# Patient Record
Sex: Female | Born: 1960 | Race: White | Hispanic: No | Smoking: Former smoker
Health system: Southern US, Community
[De-identification: ages and names within clinical notes are randomized; demographics above are authoritative.]

## PROBLEM LIST (undated history)

## (undated) DIAGNOSIS — I10 Essential (primary) hypertension: Secondary | ICD-10-CM

## (undated) DIAGNOSIS — C8590 Non-Hodgkin lymphoma, unspecified, unspecified site: Secondary | ICD-10-CM

## (undated) DIAGNOSIS — I314 Cardiac tamponade: Secondary | ICD-10-CM

## (undated) DIAGNOSIS — J9859 Other diseases of mediastinum, not elsewhere classified: Secondary | ICD-10-CM

## (undated) DIAGNOSIS — R0602 Shortness of breath: Secondary | ICD-10-CM

## (undated) DIAGNOSIS — I3139 Other pericardial effusion (noninflammatory): Secondary | ICD-10-CM

## (undated) DIAGNOSIS — B029 Zoster without complications: Secondary | ICD-10-CM

## (undated) DIAGNOSIS — F419 Anxiety disorder, unspecified: Secondary | ICD-10-CM

## (undated) DIAGNOSIS — I313 Pericardial effusion (noninflammatory): Secondary | ICD-10-CM

## (undated) DIAGNOSIS — Z5189 Encounter for other specified aftercare: Secondary | ICD-10-CM

## (undated) HISTORY — DX: Zoster without complications: B02.9

## (undated) HISTORY — PX: OTHER SURGICAL HISTORY: SHX169

## (undated) HISTORY — DX: Cardiac tamponade: I31.4

## (undated) HISTORY — DX: Anxiety disorder, unspecified: F41.9

## (undated) HISTORY — PX: BONE MARROW TRANSPLANT: SHX200

## (undated) HISTORY — DX: Encounter for other specified aftercare: Z51.89

---

## 1994-10-17 DIAGNOSIS — C8350 Lymphoblastic (diffuse) lymphoma, unspecified site: Secondary | ICD-10-CM | POA: Insufficient documentation

## 2014-01-31 DIAGNOSIS — M779 Enthesopathy, unspecified: Secondary | ICD-10-CM

## 2014-05-17 DIAGNOSIS — J9859 Other diseases of mediastinum, not elsewhere classified: Secondary | ICD-10-CM

## 2014-05-17 HISTORY — DX: Other diseases of mediastinum, not elsewhere classified: J98.59

## 2014-05-18 ENCOUNTER — Emergency Department (HOSPITAL_COMMUNITY): Payer: 59

## 2014-05-18 ENCOUNTER — Telehealth: Payer: Self-pay | Admitting: Internal Medicine

## 2014-05-18 ENCOUNTER — Encounter (HOSPITAL_COMMUNITY): Payer: Self-pay | Admitting: Emergency Medicine

## 2014-05-18 ENCOUNTER — Inpatient Hospital Stay (HOSPITAL_COMMUNITY)
Admission: EM | Admit: 2014-05-18 | Discharge: 2014-05-20 | DRG: 204 | Disposition: A | Discharge status: Other Acute Inpatient Hospital | Payer: 59 | Attending: Internal Medicine | Admitting: Internal Medicine

## 2014-05-18 DIAGNOSIS — Z79899 Other long term (current) drug therapy: Secondary | ICD-10-CM

## 2014-05-18 DIAGNOSIS — Z87891 Personal history of nicotine dependence: Secondary | ICD-10-CM | POA: Diagnosis not present

## 2014-05-18 DIAGNOSIS — D72829 Elevated white blood cell count, unspecified: Secondary | ICD-10-CM | POA: Diagnosis present

## 2014-05-18 DIAGNOSIS — I2489 Other forms of acute ischemic heart disease: Secondary | ICD-10-CM | POA: Diagnosis present

## 2014-05-18 DIAGNOSIS — F411 Generalized anxiety disorder: Secondary | ICD-10-CM

## 2014-05-18 DIAGNOSIS — I319 Disease of pericardium, unspecified: Secondary | ICD-10-CM | POA: Diagnosis present

## 2014-05-18 DIAGNOSIS — R9431 Abnormal electrocardiogram [ECG] [EKG]: Secondary | ICD-10-CM | POA: Diagnosis present

## 2014-05-18 DIAGNOSIS — R222 Localized swelling, mass and lump, trunk: Secondary | ICD-10-CM | POA: Diagnosis present

## 2014-05-18 DIAGNOSIS — R002 Palpitations: Secondary | ICD-10-CM

## 2014-05-18 DIAGNOSIS — E785 Hyperlipidemia, unspecified: Secondary | ICD-10-CM | POA: Diagnosis present

## 2014-05-18 DIAGNOSIS — I10 Essential (primary) hypertension: Secondary | ICD-10-CM | POA: Diagnosis present

## 2014-05-18 DIAGNOSIS — Z9221 Personal history of antineoplastic chemotherapy: Secondary | ICD-10-CM | POA: Diagnosis not present

## 2014-05-18 DIAGNOSIS — R000 Tachycardia, unspecified: Secondary | ICD-10-CM | POA: Diagnosis present

## 2014-05-18 DIAGNOSIS — R7989 Other specified abnormal findings of blood chemistry: Secondary | ICD-10-CM | POA: Diagnosis present

## 2014-05-18 DIAGNOSIS — I248 Other forms of acute ischemic heart disease: Secondary | ICD-10-CM | POA: Diagnosis present

## 2014-05-18 DIAGNOSIS — Z8579 Personal history of other malignant neoplasms of lymphoid, hematopoietic and related tissues: Secondary | ICD-10-CM

## 2014-05-18 DIAGNOSIS — G4700 Insomnia, unspecified: Secondary | ICD-10-CM | POA: Diagnosis present

## 2014-05-18 DIAGNOSIS — Z87898 Personal history of other specified conditions: Secondary | ICD-10-CM | POA: Diagnosis not present

## 2014-05-18 DIAGNOSIS — R0602 Shortness of breath: Secondary | ICD-10-CM

## 2014-05-18 DIAGNOSIS — J90 Pleural effusion, not elsewhere classified: Secondary | ICD-10-CM | POA: Diagnosis present

## 2014-05-18 DIAGNOSIS — J9859 Other diseases of mediastinum, not elsewhere classified: Secondary | ICD-10-CM | POA: Diagnosis present

## 2014-05-18 DIAGNOSIS — R778 Other specified abnormalities of plasma proteins: Secondary | ICD-10-CM | POA: Diagnosis present

## 2014-05-18 DIAGNOSIS — R748 Abnormal levels of other serum enzymes: Secondary | ICD-10-CM | POA: Diagnosis present

## 2014-05-18 HISTORY — DX: Essential (primary) hypertension: I10

## 2014-05-18 HISTORY — DX: Pericardial effusion (noninflammatory): I31.3

## 2014-05-18 HISTORY — DX: Other diseases of mediastinum, not elsewhere classified: J98.59

## 2014-05-18 HISTORY — DX: Other pericardial effusion (noninflammatory): I31.39

## 2014-05-18 HISTORY — DX: Non-Hodgkin lymphoma, unspecified, unspecified site: C85.90

## 2014-05-18 HISTORY — DX: Shortness of breath: R06.02

## 2014-05-18 LAB — I-STAT TROPONIN, ED: Troponin i, poc: 0.53 ng/mL (ref 0.00–0.08)

## 2014-05-18 LAB — CBC WITH DIFFERENTIAL/PLATELET
Basophils Absolute: 0.1 10*3/uL (ref 0.0–0.1)
Basophils Relative: 1 % (ref 0–1)
Eosinophils Absolute: 0.1 10*3/uL (ref 0.0–0.7)
Eosinophils Relative: 1 % (ref 0–5)
HCT: 44.8 % (ref 36.0–46.0)
Hemoglobin: 15.4 g/dL — ABNORMAL HIGH (ref 12.0–15.0)
Lymphocytes Relative: 28 % (ref 12–46)
Lymphs Abs: 3.3 10*3/uL (ref 0.7–4.0)
MCH: 31.2 pg (ref 26.0–34.0)
MCHC: 34.4 g/dL (ref 30.0–36.0)
MCV: 90.7 fL (ref 78.0–100.0)
Monocytes Absolute: 1 10*3/uL (ref 0.1–1.0)
Monocytes Relative: 9 % (ref 3–12)
Neutro Abs: 7.2 10*3/uL (ref 1.7–7.7)
Neutrophils Relative %: 61 % (ref 43–77)
Platelets: 287 10*3/uL (ref 150–400)
RBC: 4.94 MIL/uL (ref 3.87–5.11)
RDW: 14 % (ref 11.5–15.5)
WBC: 11.6 10*3/uL — ABNORMAL HIGH (ref 4.0–10.5)

## 2014-05-18 LAB — URINALYSIS, ROUTINE W REFLEX MICROSCOPIC
Glucose, UA: NEGATIVE mg/dL
Hgb urine dipstick: NEGATIVE
Ketones, ur: 15 mg/dL — AB
Leukocytes, UA: NEGATIVE
Nitrite: NEGATIVE
Protein, ur: 30 mg/dL — AB
Specific Gravity, Urine: 1.022 (ref 1.005–1.030)
Urobilinogen, UA: 0.2 mg/dL (ref 0.0–1.0)
pH: 6 (ref 5.0–8.0)

## 2014-05-18 LAB — MAGNESIUM: MAGNESIUM: 1.7 mg/dL (ref 1.5–2.5)

## 2014-05-18 LAB — COMPREHENSIVE METABOLIC PANEL
ALT: 47 U/L — ABNORMAL HIGH (ref 0–35)
AST: 48 U/L — ABNORMAL HIGH (ref 0–37)
Albumin: 4.1 g/dL (ref 3.5–5.2)
Alkaline Phosphatase: 82 U/L (ref 39–117)
Anion gap: 20 — ABNORMAL HIGH (ref 5–15)
BUN: 18 mg/dL (ref 6–23)
CO2: 21 mEq/L (ref 19–32)
Calcium: 10.1 mg/dL (ref 8.4–10.5)
Chloride: 104 mEq/L (ref 96–112)
Creatinine, Ser: 0.76 mg/dL (ref 0.50–1.10)
GFR calc Af Amer: >90 mL/min (ref >90)
GFR calc non Af Amer: >90 mL/min (ref >90)
Glucose, Bld: 96 mg/dL (ref 70–99)
Potassium: 3.9 mEq/L (ref 3.7–5.3)
Sodium: 145 mEq/L (ref 137–147)
Total Bilirubin: 0.4 mg/dL (ref 0.3–1.2)
Total Protein: 7.7 g/dL (ref 6.0–8.3)

## 2014-05-18 LAB — URINE MICROSCOPIC-ADD ON

## 2014-05-18 LAB — TROPONIN I
Troponin I: 0.89 ng/mL (ref <0.30)
Troponin I: 2.26 ng/mL (ref <0.30)
Troponin I: 2.02 ng/mL (ref <0.30)

## 2014-05-18 LAB — T4, FREE: Free T4: 1.36 ng/dL (ref 0.80–1.80)

## 2014-05-18 LAB — PROTIME-INR
INR: 1.1 (ref 0.00–1.49)
Prothrombin Time: 14.2 seconds (ref 11.6–15.2)

## 2014-05-18 LAB — TSH: TSH: 1.47 u[IU]/mL (ref 0.350–4.500)

## 2014-05-18 MED ORDER — ALLOPURINOL 300 MG PO TABS
300.0000 mg | ORAL_TABLET | Freq: Every day | ORAL | Status: DC
  Administered 2014-05-19: 300 mg via ORAL
  Filled 2014-05-18: qty 1

## 2014-05-18 MED ORDER — LISINOPRIL 5 MG PO TABS
5.0000 mg | ORAL_TABLET | Freq: Every day | ORAL | Status: DC
  Administered 2014-05-18 – 2014-05-19 (×2): 5 mg via ORAL
  Filled 2014-05-18 (×2): qty 1

## 2014-05-18 MED ORDER — IOHEXOL 350 MG/ML SOLN
100.0000 mL | Freq: Once | INTRAVENOUS | Status: AC | PRN
  Administered 2014-05-18: 100 mL via INTRAVENOUS

## 2014-05-18 MED ORDER — SODIUM CHLORIDE 0.9 % IV SOLN: INTRAVENOUS | Status: DC

## 2014-05-18 MED ORDER — METOPROLOL TARTRATE 25 MG PO TABS
25.0000 mg | ORAL_TABLET | Freq: Two times a day (BID) | ORAL | Status: DC
  Administered 2014-05-18 (×2): 25 mg via ORAL
  Filled 2014-05-18 (×4): qty 1

## 2014-05-18 MED ORDER — SODIUM CHLORIDE 0.9 % IJ SOLN
3.0000 mL | Freq: Two times a day (BID) | INTRAMUSCULAR | Status: DC
  Administered 2014-05-18 – 2014-05-19 (×4): 3 mL via INTRAVENOUS

## 2014-05-18 MED ORDER — ASPIRIN EC 325 MG PO TBEC
325.0000 mg | DELAYED_RELEASE_TABLET | Freq: Once | ORAL | Status: AC
  Administered 2014-05-18: 325 mg via ORAL
  Filled 2014-05-18: qty 1

## 2014-05-18 MED ORDER — ENOXAPARIN SODIUM 40 MG/0.4ML ~~LOC~~ SOLN
40.0000 mg | SUBCUTANEOUS | Status: DC
  Filled 2014-05-18 (×2): qty 0.4

## 2014-05-18 MED ORDER — MAGNESIUM SULFATE 40 MG/ML IJ SOLN
2.0000 g | Freq: Once | INTRAMUSCULAR | Status: AC
  Administered 2014-05-18: 2 g via INTRAVENOUS
  Filled 2014-05-18: qty 50

## 2014-05-18 MED ORDER — ZOLPIDEM TARTRATE 5 MG PO TABS
5.0000 mg | ORAL_TABLET | Freq: Every evening | ORAL | Status: DC | PRN
  Administered 2014-05-18 – 2014-05-19 (×2): 5 mg via ORAL
  Filled 2014-05-18 (×2): qty 1

## 2014-05-18 NOTE — Telephone Encounter (Signed)
I spoke with pt on Mon 8/31 and agreed to work in as new pt to my PCP panel. Pt admitted this AM for mediastinal mass in setting of DOE Chart reviewed to date. Will help arrange follow up when ready for DC Thanks to IP care team.

## 2014-05-18 NOTE — H&P (Signed)
Agree with medical student Sonny Dandy note see my note for more details   Aundra Dubin MD

## 2014-05-18 NOTE — Progress Notes (Addendum)
Patient has an elevated heart rate extending up to 150's at times. Last troponin 2.26.  Health care provider has been notified and cardiology is also aware.

## 2014-05-18 NOTE — Consult Note (Signed)
[facility]  Telephone:([phone] [facility] NOTE I have seen [facility] patient, examined her and edited [facility]yl notes as follows  C[facility]                                MR#: [id number]  DOB: [birth year]                                    CSN#: [id number]  Referring MD: Teaching Service  Primary MD:  [facility] [place]   Reason for Consult: Lymphoma   [facility] [place] [name] is a 53 y.o. female with a history of non-Hodgkin's lymphoma diagnosed in February of 1996 treated at [facility] & by Dr. Learta [facility] with chemotherapy, having been in observation for many years. Those records are currently pending, although they have been requested. She presented to [facility] emergency department with worsening shortness of breath at rest and on exertion, with orthopnea, as well as palpitations for about one month. She has been seen several times as an outpatient, Cheryl first time treated for bronchitis with antibiotics and prednisone. Her symptoms did not improve,so she sought attention of her primary care physician at [facility]. Physical at that facility was essentially unremarkable. Those records are being requested as well. As her respiratory symptoms worsened, she developed anxiety due to her inability to breathe, which brought her to [facility] emergency department. She denies any fever, chills, but she did have occasionally night sweats, last episode last night. She denies any new areas of swelling, or lymphadenopathy. She does have a dry cough but denies hemoptysis. She denies any hemoptysis. She has intermittent exertional chest pain. she denies any abdominal pain, or vomiting. She did have nausea today, which still has not resolved, which [facility] patient attributes to her lack of appetite over Cheryl last few days with a 40 lb weight loss. she denies any pain. No mental status changes. She does have generalized malaise. Denies risk for Hepatitis or HIV.  CT scan shows [facility] chest with  and without contrast on 05/18/14 was negative for PE, but remarkable for a large anterior mediastinal mass measuring at least 11.1 cm  by 6.5 cm by 8.7 cm, with extensive mediastinal adenopathy most likely due to lymphoma. Loculated but small left pleural effusion and small right pleural effusion  and pericardial effusion are noted. CT of [facility] abdomen and pelvis currently pending.  PMH:  Past Medical History  Diagnosis Date  . Non Hodgkin's lymphoma     a. Dx 1996, tx with chemotherapy.  . Pericardial effusion     a. at time of NHL diagnosis, s/p tube placement per patient.    Surgeries:  Past Surgical History  Procedure Laterality Date  . Port a cath placement      Allergies: No Known Allergies  Medications:  Scheduled Meds: . lisinopril  5 mg Oral Daily  . metoprolol tartrate  25 mg Oral BID    ROS: Constitutional: Denies fevers, chills, positive for abnormal night sweats Eyes: Denies blurriness of vision, double vision or watery eyes Ears, nose, mouth, throat, and face: Denies mucositis or sore throat Respiratory: positive cough, dyspnea but no wheezes Cardiovascular: POsitive for  palpitations since early July, positive for chest discomfort, no  lower extremity swelling Gastrointestinal: Positive  nausea,  denies heartburn or change in bowel habits. [facility] [place]  lb weight loss over Cheryl last 6 months Skin: Denies abnormal skin rashes Lymphatics: Denies new lymphadenopathy or easy bruising Neurological:Denies numbness, tingling or new weaknesses Behavioral/Psych: Mood is anxious secondary to respiratory distress All other systems were reviewed with [facility] patient and are negative.   Family History:    Family History  Problem Relation Age of Onset  . Valvular heart disease Mother   . Depression Father     No family history of hematological  disorders.  Social History:  reports that she has quit smoking. Her smoking use included Cigarettes. She smoked 0.00 packs per day. She does  not have any smokeless tobacco history on file. She reports that she does not drink alcohol or use illicit drugs. 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 Until recently she was able to walk 3-7 miles daily.    Physical Exam:  Filed Vitals:   05/18/14 1330  BP: 148/107  Pulse: 124  Temp:   Resp: 31   There were no vitals filed for this visit. ECOG PERFORMANCE STATUS: 2   Symptomatic, >50% in bed during Cheryl day (Ambulatory and capable of all self care but unable to carry out any work activities. Up and about more than 50% of   GENERAL:alert,unconfortable due to shortness of breath, anxious SKIN: skin color, texture, turgor are normal, no rashes or significant lesions EYES: normal, conjunctiva are pink and non-injected, sclera clear OROPHARYNX:no exudate, no erythema and lips, buccal mucosa, and tongue normal  NECK: supple, thyroid normal size, non-tender, without nodularity LYMPH:  no palpable lymphadenopathy in Cheryl cervical, axillary or inguinal area LUNGS: clear to auscultation and percussion with normal breathing effort HEART: tachycardia with no murmurs and no lower extremity edema ABDOMEN:abdomen soft, non-tender and normal bowel sounds Musculoskeletal:no cyanosis of digits and no clubbing  PSYCH: alert & oriented x 3 with fluent speech, anxious, tearful. NEURO: no focal motor/sensory deficits   Labs:  CBC   Recent Labs Lab 05/18/14 0940  WBC 11.6*  HGB 15.4*  HCT 44.8  PLT 287  MCV 90.7  MCH 31.2  MCHC 34.4  RDW 14.0  LYMPHSABS 3.3  MONOABS 1.0  EOSABS 0.1  BASOSABS 0.1     CMP    Recent Labs Lab 05/18/14 0940  NA 145  K 3.9  CL 104  CO2 21  GLUCOSE 96  BUN 18  CREATININE 0.76  CALCIUM 10.1  AST 48*  ALT 47*  ALKPHOS 82  BILITOT 0.4        Component Value Date/Time   BILITOT 0.4 05/18/2014 0940     Anemia panel:  No results found for this basename: VITAMINB12, FOLATE, FERRITIN, TIBC,  IRON, RETICCTPCT,  in Cheryl last 72 hours   Imaging Studies: I have reviewed [facility]yl imaging study with Cheryl patient Ct Angio Chest Pe W/cm &/or Wo Cm  05/18/2014   CLINICAL DATA:  Chest tightness.  History of lymphoma.  EXAM: CT ANGIOGRAPHY CHEST WITH CONTRAST  TECHNIQUE: Multidetector CT imaging of Cheryl chest was performed using Cheryl standard protocol during bolus administration of intravenous contrast. Multiplanar CT image reconstructions and MIPs were obtained to evaluate Cheryl vascular anatomy.  CONTRAST:  100 mL OMNIPAQUE IOHEXOL 350 MG/ML SOLN  COMPARISON:  PA and lateral chest earlier this same day.  FINDINGS: No pulmonary embolus is identified. Cheryl patient has a mass in Cheryl anterior mediastinum measuring at least 11.1 cm transverse by 6.5 cm AP by 8.7 cm craniocaudal. There are areas of  necrosis within this mass lesion. Small axillary lymph nodes are identified. Index node on Cheryl left measures 1 cm in diameter on image 18 and index node on Cheryl right measures 0.7 cm on image 26. A precarinal node measuring 1.6 cm is seen on image 42. A node anterior to Cheryl carina eccentric to Cheryl right on image 39 measures 2.4 x 2.5 cm.  [facility] patient has a small right pleural effusion and a small pericardial effusion. A somewhat larger left pleural effusion has a lobulated appearance and is circumferential about Cheryl left lung worrisome for loculation.  Scattered airspace disease in Cheryl left lung has an appearance most compatible with compressive atelectasis secondary to pleural effusion. Mild atelectasis on Cheryl right is identified.  Visualized intra-abdominal contents are unremarkable. No focal bony abnormality is identified.  Review of Cheryl MIP images confirms Cheryl above findings.  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 Less likely differential considerations include invasive thymoma or germ cell tumor. Cheryl lesion does not appear to arise from Cheryl thyroid.   Loculated but small left pleural effusion.  Small right pleural effusion pericardial effusion appears simple.  Negative for pulmonary embolus.  Cardiomegaly.   Electronically Signed   By: Inge Rise M.D.   On: 05/18/2014 12:04   A/P: 53 y.o. female with: History of Non Hodgkins Lymphoma with large mediastinal mass with adenopathy suspicious for NHL recurrence NHL diagnosed in 1996, status post chemotherapy at [facility]. She was in remission at least dating back to 2006 per patient report. She presented with increasing shortness of breath, found to have pleural effusion, as well as a very large mediastinal mass with adenopathy, suspicious for NHL recurrence. CT of Cheryl abdomen and pelvis are currently pending. She will probably need cardiothoracic surgery consultation if Ct abdomen & pelvis do not reveal other suspicious sites for safe biopsy.   Patient is to undergo biopsy of Cheryl mediastinum mass versus a lymph node that may be seen more peripherally for definite diagnosis. Dr. Benay Spice has been notified of Cheryl patient's admission, and will see Ms. Ryce upon his return as he is out of town at this time.  Admitting team is to request pertinent records from [facility] for recent history, as well as from [facility] for review of her treatments in studies performed at that facility Will continue to follow with you.  DVT prophylaxis On Lovenox  Leukocytosis Reactive due to likely NHL recurrence. [facility] patient is afebrile. She also received a course of prednisone as she was being treated for unresolved bronchitis at this time, continue to monitor, no intervention is indicated  Palpitations with tachycardia Elevated troponin  Cardiology consulted. She will need ECHO. I will start her on gentle IVF hydration  Tumor lysis prophylaxis I will order uric acid and LDH level. Will start her on allopurinol tomorrow along with IVF  Insomnia and anxiety She requested Ambien   **Disclaimer: This note  was dictated with voice recognition software. Similar sounding words can inadvertently be transcribed and this note may contain transcription errors which may not have been corrected upon publication of note.Sharene Butters E, PA-C 05/18/2014 2:32 PM  Venola Castello, MD 05/18/2014

## 2014-05-18 NOTE — ED Notes (Signed)
Attempted report 

## 2014-05-18 NOTE — Consult Note (Signed)
Cardiology Consultation Note  Patient ID: [facility], MRN: [id number], DOB/AGE: Dec 03, 1960 53 y.o. Admit date: 05/18/2014   Date of Consult: 05/18/2014 Primary Physician: Dr. Gwendolyn Grant Primary Cardiologist: New   Chief Complaint: SOB, chest discomfort Reason for Consult: chest discomfort & elevated troponin in a patient with h/o remote non-Hodgkin's lymphoma with CT scan today showing large mediastinal mass  HPI: [facility] is a 53 y/o F with history of non-Hodgkin's lymphoma associated with pericardial effusion in 1996 s/p chemotherapy but no other routine medical problems who presented to [facility] today with dyspnea and chest discomfort. Since March 2015 she has made efforts to eat healthier and exercise, walking up to 6-7 miles a day from her home to her [place] on [address]. She has gradually lost 43 lbs over 6 months' time but this was intentional weight loss. For the past 4 weeks she has been experiencing progressive SOB, DOE, and orthopnea. She has sought care at urgent care several times including once when she had a fever of 101.8 and was treated with steroids and antibiotics without relief. She denies further fever but has had night sweats the last week. She began to develop intermittent chest discomfort that later became persistent as of 8/31 - it is not worse with exertion or anything in particular other than perhaps lying back. She feels like she can't quite get a deep breath. She's had to cut down her walking to 1-2 miles a day due to her dyspnea. In the ED, POC troponin 0.53 and full troponin 0.89. WBC 11.6k, mildly elevated AST/ALT 48/47, abnormal CXR with subsequent CTA ruling out PE but showing: large anterior mediastinal mass with extensive mediastinal adenopathy is most likely due to lymphoma; less likely considerations include invasive thymoma or germ cell tumor. Loculated but small left pleural effusion, small right pleural effusion, small pericardial  effusion. She is hypertensive up to 152/108 with no prior history of such. O2 sat 93-99% RA. HR in the 120's persistently sinus tach.   She is good friends with Dr. Gwendolyn Grant, who she says will be her PCP.  Past Medical History  Diagnosis Date  . Non Hodgkin's lymphoma     a. Dx 1996, tx with chemotherapy.  . Pericardial effusion     a. at time of NHL diagnosis, s/p tube placement per patient.      Most Recent Cardiac Studies: None   Surgical History:  Past Surgical History  Procedure Laterality Date  . Port a cath placement       Home Meds: Prior to Admission medications   Medication Sig Start Date End Date Taking? Authorizing Provider  predniSONE (STERAPRED UNI-PAK) 5 MG TABS tablet Take by mouth as directed. Taper as directed for 10 days. Starting 8/29   Yes Historical Provider, MD  sertraline (ZOLOFT) 50 MG tablet Take 25 mg by mouth daily.   Yes Historical Provider, MD    Inpatient Medications:     Allergies: No Known Allergies  History   Social History  . Marital Status: Single    Spouse Name: N/A    Number of Children: N/A  . Years of Education: N/A   Occupational History  . Not on file.   Social History Main Topics  . Smoking status: Former Smoker    Types: Cigarettes  . Smokeless tobacco: Not on file  . Alcohol Use: No  . Drug Use: No  . Sexual Activity: Not on file   Other Topics Concern  .  Not on file   Social History Narrative  . No narrative on file     Family History  Problem Relation Age of Onset  . Valvular heart disease Mother   . Depression Father      Review of Systems: General: negative for chills, fever, night sweats or weight changes.  Cardiovascular: negative for chest pain, edema, orthopnea, palpitations, paroxysmal nocturnal dyspnea, shortness of breath or dyspnea on exertion Dermatological: negative for rash Respiratory: negative for cough or wheezing Urologic: negative for hematuria Abdominal: negative for  nausea, vomiting, diarrhea, bright red blood per rectum, melena, or hematemesis Neurologic: negative for visual changes, syncope, or dizziness All other systems reviewed and are otherwise negative except as noted above.  Labs:  Recent Labs  05/18/14 0940  TROPONINI 0.89*   Lab Results  Component Value Date   WBC 11.6* 05/18/2014   HGB 15.4* 05/18/2014   HCT 44.8 05/18/2014   MCV 90.7 05/18/2014   PLT 287 05/18/2014     Recent Labs Lab 05/18/14 0940  NA 145  K 3.9  CL 104  CO2 21  BUN 18  CREATININE 0.76  CALCIUM 10.1  PROT 7.7  BILITOT 0.4  ALKPHOS 82  ALT 47*  AST 48*  GLUCOSE 96   Radiology/Studies:  Dg Chest 2 View 05/18/2014   CLINICAL DATA:  Cough and tachycardia.  Lymphoma.  EXAM: CHEST  2 VIEW  COMPARISON:  None.  FINDINGS: Trachea is midline. The cardiomediastinal silhouette is enlarged. There is airspace opacification in the left perihilar region and left lung base with elevation of the left hemidiaphragm. Pleural thickening is seen posteriorly on the lateral view. Right lung is grossly clear.  IMPRESSION: 1. Left perihilar and left basilar airspace consolidation may be due to pneumonia. A centrally obstructing mass is another consideration. 2. Pleural thickening on the lateral view may be due to loculated pleural fluid. 3. Enlarged cardiomediastinal silhouette. Difficult to exclude adenopathy in this patient with a history lymphoma. 4. Patient is scheduled for CT chest later the same day.   Electronically Signed   By: Lorin Picket M.D.   On: 05/18/2014 11:18   Ct Angio Chest Pe W/cm &/or Wo Cm 05/18/2014   CLINICAL DATA:  Chest tightness.  History of lymphoma.  EXAM: CT ANGIOGRAPHY CHEST WITH CONTRAST  TECHNIQUE: Multidetector CT imaging of the chest was performed using the standard protocol during bolus administration of intravenous contrast. Multiplanar CT image reconstructions and MIPs were obtained to evaluate the vascular anatomy.  CONTRAST:  100 mL OMNIPAQUE IOHEXOL 350  MG/ML SOLN  COMPARISON:  PA and lateral chest earlier this same day.  FINDINGS: No pulmonary embolus is identified. The patient has a mass in the anterior mediastinum measuring at least 11.1 cm transverse by 6.5 cm AP by 8.7 cm craniocaudal. There are areas of necrosis within this mass lesion. Small axillary lymph nodes are identified. Index node on the left measures 1 cm in diameter on image 18 and index node on the right measures 0.7 cm on image 26. A precarinal node measuring 1.6 cm is seen on image 42. A node anterior to the carina eccentric to the right on image 39 measures 2.4 x 2.5 cm.  The patient has a small right pleural effusion and a small pericardial effusion. A somewhat larger left pleural effusion has a lobulated appearance and is circumferential about the left lung worrisome for loculation.  Scattered airspace disease in the left lung has an appearance most compatible with compressive atelectasis secondary to  pleural effusion. Mild atelectasis on the right is identified.  Visualized intra-abdominal contents are unremarkable. No focal bony abnormality is identified.  Review of the MIP images confirms the above findings.  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 Less likely differential considerations include invasive thymoma or germ cell tumor. The lesion does not appear to arise from the thyroid.  Loculated but small left pleural effusion.  Small right pleural effusion pericardial effusion appears simple.  Negative for pulmonary embolus.  Cardiomegaly.   Electronically Signed   By: Inge Rise M.D.   On: 05/18/2014 12:04   EKG: sinus tach 126-128bpm, RSR V1, diffuse nonspecific T wave changes, prolonged QTc Reviewed with MD. Is not felt to represent atrial flutter at this time.  Physical Exam: Blood pressure 148/107, pulse 124, temperature 97.7 F (36.5 C), temperature source Oral, resp. rate 31, SpO2  96.00%. General: Well developed, well nourished WF, in no acute distress. Head: Normocephalic, atraumatic, sclera non-icteric, no xanthomas, nares are without discharge.  Neck: Negative for carotid bruits. JVD not elevated. Lungs: Diminished BS at bases to 1/3 up bilaterally to auscultation. Otherwise clear in upper lung fields. No significant wheezes, rales, or rhonchi. Breathing is unlabored. Heart: Reg rhythm, tachycardic with S1 S2. No murmurs, rubs, or gallops appreciated. Abdomen: Soft, non-tender, non-distended with normoactive bowel sounds. No hepatomegaly. No rebound/guarding. No obvious abdominal masses. Msk:  Strength and tone appear normal for age. Extremities: No clubbing or cyanosis. No edema.  Distal pedal pulses are 2+ and equal bilaterally, bounding. Neuro: Alert and oriented X 3. No facial asymmetry. No focal deficit. Moves all extremities spontaneously. Psych:  Responds to questions appropriately with a normal affect given the situation.   Assessment and Plan:  1. Mediastinal mass with extensive adenopathy, suspicious for recurrent lymphoma 2. Small pericardial effusion 3. Hypertension 4. Elevated troponin likely demand ischemia from compression of mass 5. Sinus tachycardia, suspected due to compensation from #1  Patient seen and examined with Dr. Marlou Porch. At this time we feel that elevated troponin and sinus tachycardia are likely due compression and compensation as a result of her large mass. Her high blood pressure could also be contributing to dyspnea thus will start metoprolol tartrate 25mg  BID and lisinopril 5mg  daily. Can consider aspirin if OK with primary team although we doubt primary cardiac etiology at this time. Check echo (IM has ordered), TSH, free T4, and lipid panel in AM. Will need to f/u QTc when HR is lower - plan to check Mg now and reassess EKG in AM. Will follow.   Signed, Melina Copa PA-C 05/18/2014, 2:31 PM

## 2014-05-18 NOTE — ED Provider Notes (Signed)
CSN: [id number]     Arrival date & time 05/18/14  0915 History   First MD Initiated Contact with Patient 05/18/14 0930     Chief Complaint  Patient presents with  . Shortness of Breath  . Palpitations     (Consider location/radiation/quality/duration/timing/severity/associated sxs/prior Treatment) Patient is a 53 y.o. female presenting with shortness of breath and palpitations.  Shortness of Breath Palpitations Associated symptoms: shortness of breath   Patient presents to the emergency department with increasing shortness of breath, and tachycardia over the last 4 weeks.  The patient, states she's been seen by an urgent care and her primary care Dr. and treated for pneumonia and bronchitis.  The patient, states she's been placed on antibiotics, steroids, and finally, her doctor gave her Zoloft as she thought this may be related to anxiety.  The patient, states, that she has problems exerting due to shortness of breath.  The patient, states, that she's not had any chest pain, but more pressure in the center of her chest.  The patient denies headache, blurred vision, weakness, numbness, dizziness, nausea, vomiting, diarrhea, abdominal pain, back pain, fever, rash, myalgias, arthralgias, or syncope.  The patient, states, that nothing seems to make her condition, better other than sitting straight up  Past Medical History  Diagnosis Date  . Non Hodgkin's lymphoma     a. Dx 1996, tx with chemotherapy x 2 years at [facility]  . Pericardial effusion     a. at time of NHL diagnosis, s/p tube placement per patient.   Past Surgical History  Procedure Laterality Date  . Port a cath placement     Family History  Problem Relation Age of Onset  . Valvular heart disease Mother   . Depression Father     deceased ? cause   . Cancer Mother     breast still living age 64 y.o    History  Substance Use Topics  . Smoking status: Former Smoker    Types: Cigarettes  . Smokeless tobacco: Not on file  .  Alcohol Use: No   OB History   Grav Para Term Preterm Abortions TAB SAB Ect Mult Living                 Review of Systems  Respiratory: Positive for shortness of breath.   Cardiovascular: Positive for palpitations.   All other systems negative except as documented in the HPI. All pertinent positives and negatives as reviewed in the HPI.   Allergies  Review of patient's allergies indicates no known allergies.  Home Medications   Prior to Admission medications   Medication Sig Start Date End Date Taking? Authorizing Provider  predniSONE (STERAPRED UNI-PAK) 5 MG TABS tablet Take by mouth as directed. Taper as directed for 10 days. Starting 8/29   Yes Historical Provider, MD  sertraline (ZOLOFT) 50 MG tablet Take 25 mg by mouth daily.   Yes Historical Provider, MD   BP 143/109  Pulse 117  Temp(Src) 97.7 F (36.5 C) (Oral)  Resp 24  SpO2 96% Physical Exam  Nursing note and vitals reviewed. Constitutional: She is oriented to person, place, and time. She appears well-developed and well-nourished. No distress.  HENT:  Head: Normocephalic and atraumatic.  Mouth/Throat: Oropharynx is clear and moist.  Eyes: Pupils are equal, round, and reactive to light.  Neck: Normal range of motion. Neck supple.  Cardiovascular: Normal rate, regular rhythm and normal heart sounds.  Exam reveals no gallop and no friction rub.   No murmur heard.  Pulmonary/Chest: Tachypnea noted. No respiratory distress. She has decreased breath sounds in the left lower field. She has no rales.  Abdominal: Soft. Bowel sounds are normal. She exhibits no distension. There is no tenderness.  Musculoskeletal: Normal range of motion. She exhibits no edema.  Neurological: She is alert and oriented to person, place, and time. She exhibits normal muscle tone. Coordination normal.  Skin: Skin is warm and dry. No rash noted. No erythema.    ED Course  Procedures (including critical care time) Labs Review Labs Reviewed   COMPREHENSIVE METABOLIC PANEL - Abnormal; Notable for the following:    AST 48 (*)    ALT 47 (*)    Anion gap 20 (*)    All other components within normal limits  CBC WITH DIFFERENTIAL - Abnormal; Notable for the following:    WBC 11.6 (*)    Hemoglobin 15.4 (*)    All other components within normal limits  URINALYSIS, ROUTINE W REFLEX MICROSCOPIC - Abnormal; Notable for the following:    Color, Urine AMBER (*)    Bilirubin Urine SMALL (*)    Ketones, ur 15 (*)    Protein, ur 30 (*)    All other components within normal limits  TROPONIN I - Abnormal; Notable for the following:    Troponin I 0.89 (*)    All other components within normal limits  URINE MICROSCOPIC-ADD ON - Abnormal; Notable for the following:    Crystals CA OXALATE CRYSTALS (*)    All other components within normal limits  I-STAT TROPOININ, ED - Abnormal; Notable for the following:    Troponin i, poc 0.53 (*)    All other components within normal limits  TSH  T4, FREE  PROTIME-INR  MAGNESIUM  TROPONIN I  TROPONIN I    Imaging Review Dg Chest 2 View  05/18/2014   CLINICAL DATA:  Cough and tachycardia.  Lymphoma.  EXAM: CHEST  2 VIEW  COMPARISON:  None.  FINDINGS: Trachea is midline. The cardiomediastinal silhouette is enlarged. There is airspace opacification in the left perihilar region and left lung base with elevation of the left hemidiaphragm. Pleural thickening is seen posteriorly on the lateral view. Right lung is grossly clear.  IMPRESSION: 1. Left perihilar and left basilar airspace consolidation may be due to pneumonia. A centrally obstructing mass is another consideration. 2. Pleural thickening on the lateral view may be due to loculated pleural fluid. 3. Enlarged cardiomediastinal silhouette. Difficult to exclude adenopathy in this patient with a history lymphoma. 4. Patient is scheduled for CT chest later the same day.   Electronically Signed   By: Lorin Picket M.D.   On: 05/18/2014 11:18   Ct Angio  Chest Pe W/cm &/or Wo Cm  05/18/2014   CLINICAL DATA:  Chest tightness.  History of lymphoma.  EXAM: CT ANGIOGRAPHY CHEST WITH CONTRAST  TECHNIQUE: Multidetector CT imaging of the chest was performed using the standard protocol during bolus administration of intravenous contrast. Multiplanar CT image reconstructions and MIPs were obtained to evaluate the vascular anatomy.  CONTRAST:  100 mL OMNIPAQUE IOHEXOL 350 MG/ML SOLN  COMPARISON:  PA and lateral chest earlier this same day.  FINDINGS: No pulmonary embolus is identified. The patient has a mass in the anterior mediastinum measuring at least 11.1 cm transverse by 6.5 cm AP by 8.7 cm craniocaudal. There are areas of necrosis within this mass lesion. Small axillary lymph nodes are identified. Index node on the left measures 1 cm in diameter on image 18 and  index node on the right measures 0.7 cm on image 26. A precarinal node measuring 1.6 cm is seen on image 42. A node anterior to the carina eccentric to the right on image 39 measures 2.4 x 2.5 cm.  The patient has a small right pleural effusion and a small pericardial effusion. A somewhat larger left pleural effusion has a lobulated appearance and is circumferential about the left lung worrisome for loculation.  Scattered airspace disease in the left lung has an appearance most compatible with compressive atelectasis secondary to pleural effusion. Mild atelectasis on the right is identified.  Visualized intra-abdominal contents are unremarkable. No focal bony abnormality is identified.  Review of the MIP images confirms the above findings.  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 Less likely differential considerations include invasive thymoma or germ cell tumor. The lesion does not appear to arise from the thyroid.  Loculated but small left pleural effusion.  Small right pleural effusion pericardial effusion appears simple.  Negative for  pulmonary embolus.  Cardiomegaly.   Electronically Signed   By: Inge Rise M.D.   On: 05/18/2014 12:04     EKG Interpretation   Date/Time:  Wednesday May 18 2014 09:19:40 EDT Ventricular Rate:  126 PR Interval:  132 QRS Duration: 74 QT Interval:  304 QTC Calculation: 440 R Axis:   4 Text Interpretation:  Sinus tachycardia Low voltage QRS Cannot rule out  Anterior infarct , age undetermined T wave abnormality, consider  inferolateral ischemia Abnormal ECG wandering baseline No previous ECGs  available Confirmed by ZACKOWSKI  MD, SCOTT [id number]) on 05/18/2014 9:31:21 AM      Patient will be admitted for a mediastinal mass.  I spoke with internal medicine resident and cardiology about the patient.  Patient be admitted to the hospital for further evaluation and care.  Patient is given the plan and all questions were answered   Brent General, PA-C 05/18/14 1635

## 2014-05-18 NOTE — Progress Notes (Signed)
      Troponin now 2.2. Would continue with current plan: ECHO in AM. ASA only if OK with primary team. This troponin elevation is consistent with myocardial irritation from small pericardial effusion/ demand ischemia in the setting of ongoing tachycardia and large mediastinal mass. I do not believe this is acute coronary syndrome.   I would proceed with IR biopsy tomorrow.   Candee Furbish, MD

## 2014-05-18 NOTE — H&P (Signed)
Date: 05/18/2014               Patient Name:  Cheryl Knox MRN: [id number]  DOB: [birth year] Age / Sex: 53 y.o., female   PCP: Will establish with [place] Service: Internal Medicine Teaching Service         Attending Physician: Dr. Madilyn Fireman, MD    First Contact: MS 4 Charise Killian Pager: [phone]  Second Contact: Dr. Aundra Dubin  Pager: [phone]       After Hours (After 5p/  First Contact Pager: [phone]  weekends / holidays): Second Contact Pager: [phone]   Chief Complaint: sob with exertion, palpitations  History of Present Illness:  53 y.o with history of NHL 15 years ago (1996) tx'ed with chemo x 2 years presents with sob with exertion, palpitations x 1 month.  She went to an urgent care clinic recently 04/16/14 and was diagnosed with bronchitis and given Prednisone, hydrocodeine cough syrup and Levoquin (x 2 separate weeks) to take but she still was not feeling well.  She stopped the antibiotic because it made her dizzy and nauseated.  She states she has had worsening shortness of breath especially with exertion x 6 weeks to the point where she has stopped her physical activity such as walking 3-7 miles within the last 2-3 weeks.  She feels like she has not been taking full deep breath only shallow breath.  She breaths better sitting up rather than lying down.  She has had decreased ability to sleep at night due to breathing.  Her shortness of breath is making her anxious as well.   She started taking Zoloft on Saturday prior to admission.    Meds: Current Facility-Administered Medications  Medication Dose Route Frequency Provider Last Rate Last Dose  . 0.9 %  sodium chloride infusion   Intravenous Continuous Ni Gorsuch, MD      . enoxaparin (LOVENOX) injection 40 mg  40 mg Subcutaneous Q24H Cresenciano Genre, MD      . lisinopril (PRINIVIL,ZESTRIL) tablet 5 mg  5 mg Oral Daily Dayna N Dunn, PA-C      . metoprolol tartrate (LOPRESSOR) tablet 25 mg  25 mg Oral BID Dayna  N Dunn, PA-C      . sodium chloride 0.9 % injection 3 mL  3 mL Intravenous Q12H Cresenciano Genre, MD      . zolpidem (AMBIEN) tablet 5 mg  5 mg Oral QHS PRN Heath Lark, MD        Allergies: Allergies as of 05/18/2014  . (No Known Allergies)   Past Medical History  Diagnosis Date  . Non Hodgkin's lymphoma     a. Dx 1996, tx with chemotherapy x 2 years at [facility]  . Pericardial effusion     a. at time of NHL diagnosis, s/p tube placement per patient.   Past Surgical History  Procedure Laterality Date  . Port a cath placement     Family History  Problem Relation Age of Onset  . Valvular heart disease Mother   . Depression Father     deceased ? cause   . Cancer Mother     breast still living age 66 y.o    History   Social History  . Marital Status: Single    Spouse Name: N/A    Number of Children: N/A  . Years of Education: N/A   Occupational History  . Not on file.   Social History  Main Topics  . Smoking status: Former Smoker    Types: Cigarettes  . Smokeless tobacco: Not on file  . Alcohol Use: No  . Drug Use: No  . Sexual Activity: Not on file   Other Topics Concern  . Not on file   Social History Narrative   2 siblings healthy   No kids   Owns [employer] [address]    2 dogs    Denies drugs, alcohol, smoking, wine occasionally   Recently cancelled her colonoscopy 05/09/14   Review of Systems: General: + fever in 04/16/2014, h/o chills, +night sweats, decreased appetite, decreased exercise tolerance due to sob, +weight loss (>40 lbs but she is exercising-used to weigh 213.4 6 months ago HEENT: Denies h/a  Cardiac: denies chest pain/pleuritic chest pain, +feeling like chest pressure/heaviness (constant)+palpitations (since 03/19/14) Pulm: +dry cough (denies blood), sob with exertion  Abd: denies abdomen pain, diarrhea, constipation, denies GU blood, +nauseated x 1 week Ext: denies lower extremity edema  Neuro: denies h/a, vision changes, denies syncope Psych: anxious  (worsening x 4 days) about being sob recently    Physical Exam: On exam VS HR 124, 95-97% 20 148/107 (115) then 153/115 (122) Blood pressure 143/109, pulse 117, temperature 97.7 F (36.5 C), temperature source Oral, resp. rate 24, height 5\' 4"  (1.626 m), weight 176 lb 6.4 oz (80.015 kg), SpO2 96.00%. Vitals reviewed. General: resting in bed, NAD, somewhat tearful on exam  HEENT: [place]/at, no scleral icterus, no cervical/axillary adenopathy Cardiac: tachycardic, no rubs, murmurs or gallops Pulm: clear to auscultation though slightly decreased at bases Abd: soft, nontender, nondistended, BS present Ext: warm and well perfused, no pedal edema Neuro: alert and oriented X3, cranial nerves II-XII grossly intact  Lab results: Basic Metabolic Panel:  Recent Labs  05/18/14 0940 05/18/14 1620  NA 145  --   K 3.9  --   CL 104  --   CO2 21  --   GLUCOSE 96  --   BUN 18  --   CREATININE 0.76  --   CALCIUM 10.1  --   MG  --  1.7   Liver Function Tests:  Recent Labs  05/18/14 0940  AST 48*  ALT 47*  ALKPHOS 82  BILITOT 0.4  PROT 7.7  ALBUMIN 4.1   CBC:  Recent Labs  05/18/14 0940  WBC 11.6*  NEUTROABS 7.2  HGB 15.4*  HCT 44.8  MCV 90.7  PLT 287   Cardiac Enzymes:  Recent Labs  05/18/14 0940 05/18/14 1615  TROPONINI 0.89* 2.26*     Urinalysis:  Recent Labs  05/18/14 1150  COLORURINE AMBER*  LABSPEC 1.022  PHURINE 6.0  GLUCOSEU NEGATIVE  HGBUR NEGATIVE  BILIRUBINUR SMALL*  KETONESUR 15*  PROTEINUR 30*  UROBILINOGEN 0.2  NITRITE NEGATIVE  LEUKOCYTESUR NEGATIVE   Misc. Labs: Trop x 3   Imaging results:  Dg Chest 2 View  05/18/2014   CLINICAL DATA:  Cough and tachycardia.  Lymphoma.  EXAM: CHEST  2 VIEW  COMPARISON:  None.  FINDINGS: Trachea is midline. The cardiomediastinal silhouette is enlarged. There is airspace opacification in the left perihilar region and left lung base with elevation of the left hemidiaphragm. Pleural thickening is seen  posteriorly on the lateral view. Right lung is grossly clear.  IMPRESSION: 1. Left perihilar and left basilar airspace consolidation may be due to pneumonia. A centrally obstructing mass is another consideration. 2. Pleural thickening on the lateral view may be due to loculated pleural fluid. 3. Enlarged cardiomediastinal  silhouette. Difficult to exclude adenopathy in this patient with a history lymphoma. 4. Patient is scheduled for CT chest later the same day.   Electronically Signed   By: Lorin Picket M.D.   On: 05/18/2014 11:18   Ct Angio Chest Pe W/cm &/or Wo Cm  05/18/2014   CLINICAL DATA:  Chest tightness.  History of lymphoma.  EXAM: CT ANGIOGRAPHY CHEST WITH CONTRAST  TECHNIQUE: Multidetector CT imaging of the chest was performed using the standard protocol during bolus administration of intravenous contrast. Multiplanar CT image reconstructions and MIPs were obtained to evaluate the vascular anatomy.  CONTRAST:  100 mL OMNIPAQUE IOHEXOL 350 MG/ML SOLN  COMPARISON:  PA and lateral chest earlier this same day.  FINDINGS: No pulmonary embolus is identified. The patient has a mass in the anterior mediastinum measuring at least 11.1 cm transverse by 6.5 cm AP by 8.7 cm craniocaudal. There are areas of necrosis within this mass lesion. Small axillary lymph nodes are identified. Index node on the left measures 1 cm in diameter on image 18 and index node on the right measures 0.7 cm on image 26. A precarinal node measuring 1.6 cm is seen on image 42. A node anterior to the carina eccentric to the right on image 39 measures 2.4 x 2.5 cm.  The patient has a small right pleural effusion and a small pericardial effusion. A somewhat larger left pleural effusion has a lobulated appearance and is circumferential about the left lung worrisome for loculation.  Scattered airspace disease in the left lung has an appearance most compatible with compressive atelectasis secondary to pleural effusion. Mild atelectasis on the  right is identified.  Visualized intra-abdominal contents are unremarkable. No focal bony abnormality is identified.  Review of the MIP images confirms the above findings.  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 Less likely differential considerations include invasive thymoma or germ cell tumor. The lesion does not appear to arise from the thyroid.  Loculated but small left pleural effusion.  Small right pleural effusion pericardial effusion appears simple.  Negative for pulmonary embolus.  Cardiomegaly.   Electronically Signed   By: Inge Rise M.D.   On: 05/18/2014 12:04    Other results: EKG: ST, initially normal QT then repeat QT 584, nl axis, T wave inv in 2, V4, V5, V6, low voltage   Assessment & Plan by Problem: 53 y.o history of NHL presents with sob with exertion, palpitations found to have mediastinal mass on CT   #Mediastinal mass -Anterior mediastinal mass is possibly lymphoma (likely with history of lymphoma in 1996) though other ddx thymus tumor, germ cell, or thyroid mass.   -Hematology/oncology consulted with recommendations.  Outpatient patient wants to follow with Dr. Ammie Dalton   -IR consulted for biopsy -will do CT ab/pelvis to further w/u, pending echo to evaluate cardiac function and pericardial effusion  -pending LDH, lymphocyte flow cytometry, uric acid  -Oxygen prn if short of breath  #Elevated troponin likely demand ischemia  -May be due to cardiac stress from anterior mass  -Cardiology consulted they recommend echo and will follow   -will trend enzymes x 3  -Aspirin 325 mg x 1    #Tachycardia -could be related to anterior mass causing sob leading to tachycardia  -will check tsh, free T4  -will monitor on telemetry   #Elevated liver enzymes -could be related to underlying malignancy  -pending CT ab/pelvis, pending hepatitis labs ordered by H/O -will trend CMET   #  Leukocytosis    -could be related to recent steroid use  -will trend CBC  #HTN -Cardiology started Lisinopril 5 mg qd, Lopressor 25 mg bid  -lipid panel in am   #Pleural effusions -Etiology could be secondary to underlying malignancy   #QT prolongation  -QT 584 on repeat EKG.  recent use of Levaquin -will trend EKG in the am   #insomnia -Ambien prn   #F/E/N -No IVF -labs in am -cardiac diet then NPO after MN   #DVT px  -Lovenox, scds    Dispo: Disposition is deferred at this time, awaiting improvement of current medical problems. Anticipated discharge in approximately 2-3 day(s).   The patient wants to establish with [facility] Dr. Gwendolyn Grant   The patient does not have transportation limitations that hinder transportation to clinic appointments.  Signed: Cresenciano Genre, MD [phone] 05/18/2014, 5:13 PM

## 2014-05-18 NOTE — ED Notes (Signed)
Elevated troponin reported to Database administrator.

## 2014-05-18 NOTE — Consult Note (Signed)
Personally seen and examined. Agree with above. Chest mass, likely recurrent lymphoma (await tissue diagnosis) Will place on metoprolol 25 BID and lisinopril 5. Currently hypertensive.  This should help with tachycardia (will continue to monitor BP closely - no current clinical signs of extrinsic compression on right sided heart structures) Mildly elevated troponin in the setting of underlying tachycardia and chest mass (demand ischemia). Not ACS.  Will follow along.  Agree with ECHO in AM. Hopefully tachycardia will be improved.   Candee Furbish, MD

## 2014-05-18 NOTE — ED Notes (Signed)
Placed pt on 2 L O2 nasal cannula.

## 2014-05-18 NOTE — ED Notes (Signed)
Pt reports having palpitations, tachycardia and sob x 4 weeks. Has been to ucc and was diagnosed with bronchitis and given prednisone and antibiotic but still feeling sob. Airway intact at triage and ekg done.

## 2014-05-18 NOTE — H&P (Signed)
Date: 05/18/2014               Patient Name:  Cheryl Knox MRN: [id number]  DOB: 02/07/1961 Age / Sex: 53 y.o., female   PCP:  Code Status: Full  Emergency Contact:  Lytle Michaels [phone] Gwendolyn Grant [phone]                Medical Service: Internal Medicine Teaching Service              Attending Physician: Dr. Silas [place]                                   Chief Complaint: Shortness of Breath  History of Present Illness: Cheryl Knox is a 53yo woman with a PMH of Non-Hodgkin's Lymphoma diagnosed by Dr. Benay Spice treated at [facility] in 1996.  She presents to [facility] with 6 weeks of progressive SOB.  She first experienced what she describes as "chest tightness and labored breathing" on August 1st.  At this time, she was diagnosed by an urgent care provider with acute bronchitis and prescribed 14 days of levofloxacin and prednisone (at the time of admission, she is tapering from prednisone 5mg  qd).  In the intervening period, the severity of the dyspnea has increased to the point that she cannot walk or lie flat without significant shortness of breath.  She further describes her symptoms as the inability to take deep breaths accompanied by chest pressure.  She has also had a dry, non-productive cough.   Other pertinent positives include a few recent episodes of night sweats and a 40lb weight loss over the last 6 months.  She says that she has been consciously attempting to lose weight through diet and exercise.  She has had recent nausea without vomiting.  Finally, she has had significant dyspnea-associated anxiety which has prevented sleep for the last 5 nights.  She denies headaches, chest pain, pleurisy, diarrhea, melena, hematochezia, urinary difficulties, andloss of appetite.  A full ROS was otherwise negative.  Meds: No current facility-administered medications for this encounter.   Current Outpatient Prescriptions  Medication Sig Dispense Refill  .  predniSONE (STERAPRED UNI-PAK) 5 MG TABS tablet Take by mouth as directed. Taper as directed for 10 days. Starting 8/29      . sertraline (ZOLOFT) 50 MG tablet Take 25 mg by mouth daily.        Allergies: Allergies as of 05/18/2014  . (No Known Allergies)   Past Medical History  Diagnosis Date  . Non Hodgkin's lymphoma    History reviewed. No pertinent past surgical history. History reviewed. No pertinent family history. History   Social History  . Marital Status: Single    Spouse Name: N/A    Number of Children: N/A  . Years of Education: N/A   Occupational History  . Not on file.   Social History Main Topics  . Smoking status: Former Smoker    Types: Cigarettes  . Smokeless tobacco: Not on file  . Alcohol Use: No  . Drug Use: No  . Sexual Activity: Not on file   Other Topics Concern  . Not on file   Social History Narrative  . No narrative on file    Review of Systems: Pertinent items are noted in HPI.  Physical Exam: Blood pressure 146/109, pulse 124, temperature 97.7 F (36.5 C), temperature source Oral, resp. rate 26, SpO2 95.00%. Filed Vitals:   05/18/14 1300  05/18/14 1330 05/18/14 1400 05/18/14 1430  BP: 152/108 148/107 153/115 163/107  Pulse: 125 124 126 123  Temp:      TempSrc:      Resp: 29 31 19 31   SpO2: 96% 96% 94% 94%   General: Vital signs reviewed.  Patient is well-developed and well-nourished.  Anxious appearing, sometimes tearful. Head: Normocephalic and atraumatic. Eyes: EOMI, conjunctivae normal, no scleral icterus.  Neck: Supple, trachea midline, normal ROM, no JVD, masses, thyromegaly, or carotid bruit present.  Cardiovascular: tachycardic, S1 normal, S2 normal, no murmurs, gallops, or rubs. Pulmonary/Chest: Decreased breath sounds with coarse crackles on the left. Abdominal: Soft, non-tender, non-distended, BS +, no masses, organomegaly, or guarding present.  Musculoskeletal: No joint deformities, erythema, or stiffness, ROM full  and nontender. Extremities: No lower extremity edema bilaterally,  pulses symmetric and intact bilaterally. No cyanosis or clubbing. Neurological: A&O x3, Strength is normal and symmetric bilaterally, cranial nerve II-XII are grossly intact, no focal motor deficit, sensory intact to light touch bilaterally.  Skin: Warm, dry and intact. No rashes or erythema. Psychiatric: Normal mood and affect. speech and behavior is normal. Cognition and memory are normal.   Lab results: Basic Metabolic Panel:  Recent Labs  05/18/14 0940  NA 145  K 3.9  CL 104  CO2 21  GLUCOSE 96  BUN 18  CREATININE 0.76  CALCIUM 10.1   Liver Function Tests:  Recent Labs  05/18/14 0940  AST 48*  ALT 47*  ALKPHOS 82  BILITOT 0.4  PROT 7.7  ALBUMIN 4.1   No results found for this basename: LIPASE, AMYLASE,  in the last 72 hours No results found for this basename: AMMONIA,  in the last 72 hours CBC:  Recent Labs  05/18/14 0940  WBC 11.6*  NEUTROABS 7.2  HGB 15.4*  HCT 44.8  MCV 90.7  PLT 287   Cardiac Enzymes:  Recent Labs  05/18/14 0940  TROPONINI 0.89*   BNP: No results found for this basename: PROBNP,  in the last 72 hours D-Dimer: No results found for this basename: DDIMER,  in the last 72 hours CBG: No results found for this basename: GLUCAP,  in the last 72 hours Hemoglobin A1C: No results found for this basename: HGBA1C,  in the last 72 hours Fasting Lipid Panel: No results found for this basename: CHOL, HDL, LDLCALC, TRIG, CHOLHDL, LDLDIRECT,  in the last 72 hours Thyroid Function Tests: No results found for this basename: TSH, T4TOTAL, FREET4, T3FREE, THYROIDAB,  in the last 72 hours Anemia Panel: No results found for this basename: VITAMINB12, FOLATE, FERRITIN, TIBC, IRON, RETICCTPCT,  in the last 72 hours Coagulation: No results found for this basename: LABPROT, INR,  in the last 72 hours Urine Drug Screen: Drugs of Abuse  No results found for this basename: labopia,  cocainscrnur, labbenz, amphetmu, thcu, labbarb    Alcohol Level: No results found for this basename: ETH,  in the last 72 hours Urinalysis:  Recent Labs  05/18/14 1150  COLORURINE AMBER*  LABSPEC 1.022  PHURINE 6.0  GLUCOSEU NEGATIVE  HGBUR NEGATIVE  BILIRUBINUR SMALL*  KETONESUR 15*  PROTEINUR 30*  UROBILINOGEN 0.2  NITRITE NEGATIVE  LEUKOCYTESUR NEGATIVE   Misc. Labs:   Imaging results:  Dg Chest 2 View  05/18/2014   CLINICAL DATA:  Cough and tachycardia.  Lymphoma.  EXAM: CHEST  2 VIEW  COMPARISON:  None.  FINDINGS: Trachea is midline. The cardiomediastinal silhouette is enlarged. There is airspace opacification in the left perihilar region and left  lung base with elevation of the left hemidiaphragm. Pleural thickening is seen posteriorly on the lateral view. Right lung is grossly clear.  IMPRESSION: 1. Left perihilar and left basilar airspace consolidation may be due to pneumonia. A centrally obstructing mass is another consideration. 2. Pleural thickening on the lateral view may be due to loculated pleural fluid. 3. Enlarged cardiomediastinal silhouette. Difficult to exclude adenopathy in this patient with a history lymphoma. 4. Patient is scheduled for CT chest later the same day.   Electronically Signed   By: Lorin Picket M.D.   On: 05/18/2014 11:18   Ct Angio Chest Pe W/cm &/or Wo Cm  05/18/2014   CLINICAL DATA:  Chest tightness.  History of lymphoma.  EXAM: CT ANGIOGRAPHY CHEST WITH CONTRAST  TECHNIQUE: Multidetector CT imaging of the chest was performed using the standard protocol during bolus administration of intravenous contrast. Multiplanar CT image reconstructions and MIPs were obtained to evaluate the vascular anatomy.  CONTRAST:  100 mL OMNIPAQUE IOHEXOL 350 MG/ML SOLN  COMPARISON:  PA and lateral chest earlier this same day.  FINDINGS: No pulmonary embolus is identified. The patient has a mass in the anterior mediastinum measuring at least 11.1 cm transverse by 6.5 cm  AP by 8.7 cm craniocaudal. There are areas of necrosis within this mass lesion. Small axillary lymph nodes are identified. Index node on the left measures 1 cm in diameter on image 18 and index node on the right measures 0.7 cm on image 26. A precarinal node measuring 1.6 cm is seen on image 42. A node anterior to the carina eccentric to the right on image 39 measures 2.4 x 2.5 cm.  The patient has a small right pleural effusion and a small pericardial effusion. A somewhat larger left pleural effusion has a lobulated appearance and is circumferential about the left lung worrisome for loculation.  Scattered airspace disease in the left lung has an appearance most compatible with compressive atelectasis secondary to pleural effusion. Mild atelectasis on the right is identified.  Visualized intra-abdominal contents are unremarkable. No focal bony abnormality is identified.  Review of the MIP images confirms the above findings.  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 Less likely differential considerations include invasive thymoma or germ cell tumor. The lesion does not appear to arise from the thyroid.  Loculated but small left pleural effusion.  Small right pleural effusion pericardial effusion appears simple.  Negative for pulmonary embolus.  Cardiomegaly.   Electronically Signed   By: Inge Rise M.D.   On: 05/18/2014 12:04    Other results: EKG: sinus tachycardia. No evidence of MI.  Assessment: Cheryl Knox is a 53yo woman with a PMH of Non-Hodgkin's Lymphoma who presents with progressive SOB 2/2 mediastinal mass.  Stable.  Plan:  Dyspnea/pleural effusion 2/2 mediastinal mass: Mass concerning for recurrence of lymphoma.  Other considerations include invasive thymoma and germ cell tumor.  Also consider post-obstructive pneumonia (SIRS 2/4), though patient does not appear septic.  Threshold for initiation of antibiotics will be  low if condition worsens.  Tissue will be required for definitive diagnosis, and oncology is aware of the patient; patient requests to see Dr. Learta Codding.  Clinically, the patient is uncomfortably dyspneic with exertion and orthopnea, but does not require oxygen at this time. - Appreciate oncology recs - IR consulted for mediastinal mass biopsy - f/u CT abd/pelvis 9/3 - Supplemental O2 for desat < 88  Tachycardia/Elevatd Troponin: EKG not concerning for MI  at this time.  Elevated troponin and tachycardia likely 2/2 to mild pericardial effusion and physical pressure exerted by mediastinal mass.  Cardiology has been consulted. - Appreciate cardiology recs - trend trops - f/u TSH, free T4, hep serologies - f/u echo 9/3 - monitor with telemetry  Prolonged QTc: 584.  Magnesium 1.7. -CTM with EKG tomorrow AM  Hypertension/Tachycardia:  Most likely 2/2 pericardial effusion, considering lack of PMH.  May also be related to anxiety. - lisonopril 5mg  po qd - metoprolol tartrate 25 mg po bid  Proteinuria/Ketonuria: Unclear etiology at this time.  Patient not showing symptoms.  Glucose and renal function WNL. Will initiate work-up for nephrotic syndrome. - f/u albumin, total protein. Urine P:C  Anxiety: 2/2 dyspnea, though there may be some underlying anxiety.  Patient wishes to discontinue Zoloft, since she has only been taking since 8/29. - zolpidem for sleep PRN     This is a Careers information officer Note.  The care of the patient was discussed with Dr. Aundra Dubin and the assessment and plan was formulated with their assistance.  Please see their note for official documentation of the patient encounter.   Signed: Charise Killian, Med Student 05/18/2014, 1:03 PM

## 2014-05-19 ENCOUNTER — Encounter (HOSPITAL_COMMUNITY): Payer: Self-pay | Admitting: General Practice

## 2014-05-19 ENCOUNTER — Inpatient Hospital Stay (HOSPITAL_COMMUNITY): Payer: 59

## 2014-05-19 DIAGNOSIS — I10 Essential (primary) hypertension: Secondary | ICD-10-CM

## 2014-05-19 DIAGNOSIS — I319 Disease of pericardium, unspecified: Secondary | ICD-10-CM

## 2014-05-19 DIAGNOSIS — E785 Hyperlipidemia, unspecified: Secondary | ICD-10-CM

## 2014-05-19 DIAGNOSIS — R222 Localized swelling, mass and lump, trunk: Secondary | ICD-10-CM

## 2014-05-19 DIAGNOSIS — F411 Generalized anxiety disorder: Secondary | ICD-10-CM

## 2014-05-19 LAB — LIPID PANEL
Cholesterol: 229 mg/dL — ABNORMAL HIGH (ref 0–200)
HDL: 43 mg/dL (ref >39)
LDL Cholesterol: 162 mg/dL — ABNORMAL HIGH (ref 0–99)
Total CHOL/HDL Ratio: 5.3 RATIO
Triglycerides: 122 mg/dL (ref <150)
VLDL: 24 mg/dL (ref 0–40)

## 2014-05-19 LAB — CBC WITH DIFFERENTIAL/PLATELET
BASOS ABS: 0 10*3/uL (ref 0.0–0.1)
BASOS PCT: 0 % (ref 0–1)
Eosinophils Absolute: 0.1 10*3/uL (ref 0.0–0.7)
Eosinophils Relative: 1 % (ref 0–5)
HEMATOCRIT: 40.6 % (ref 36.0–46.0)
Hemoglobin: 13.8 g/dL (ref 12.0–15.0)
Lymphocytes Relative: 29 % (ref 12–46)
Lymphs Abs: 3 10*3/uL (ref 0.7–4.0)
MCH: 30.1 pg (ref 26.0–34.0)
MCHC: 34 g/dL (ref 30.0–36.0)
MCV: 88.5 fL (ref 78.0–100.0)
MONO ABS: 0.9 10*3/uL (ref 0.1–1.0)
Monocytes Relative: 9 % (ref 3–12)
NEUTROS ABS: 6.3 10*3/uL (ref 1.7–7.7)
NEUTROS PCT: 61 % (ref 43–77)
Platelets: 270 10*3/uL (ref 150–400)
RBC: 4.59 MIL/uL (ref 3.87–5.11)
RDW: 14.2 % (ref 11.5–15.5)
WBC: 10.3 10*3/uL (ref 4.0–10.5)

## 2014-05-19 LAB — COMPREHENSIVE METABOLIC PANEL
ALT: 37 U/L — ABNORMAL HIGH (ref 0–35)
ANION GAP: 20 — AB (ref 5–15)
AST: 40 U/L — ABNORMAL HIGH (ref 0–37)
Albumin: 3.8 g/dL (ref 3.5–5.2)
Alkaline Phosphatase: 75 U/L (ref 39–117)
BILIRUBIN TOTAL: 0.4 mg/dL (ref 0.3–1.2)
BUN: 19 mg/dL (ref 6–23)
CHLORIDE: 102 meq/L (ref 96–112)
CO2: 22 mEq/L (ref 19–32)
Calcium: 9.7 mg/dL (ref 8.4–10.5)
Creatinine, Ser: 0.73 mg/dL (ref 0.50–1.10)
GFR calc non Af Amer: >90 mL/min (ref >90)
GLUCOSE: 83 mg/dL (ref 70–99)
Potassium: 4.2 mEq/L (ref 3.7–5.3)
Sodium: 144 mEq/L (ref 137–147)
Total Protein: 6.9 g/dL (ref 6.0–8.3)

## 2014-05-19 LAB — HEPATITIS B SURFACE ANTIGEN: HEP B S AG: NEGATIVE

## 2014-05-19 LAB — URINALYSIS, ROUTINE W REFLEX MICROSCOPIC
Glucose, UA: NEGATIVE mg/dL
Hgb urine dipstick: NEGATIVE
KETONES UR: NEGATIVE mg/dL
Leukocytes, UA: NEGATIVE
Nitrite: NEGATIVE
PH: 5 (ref 5.0–8.0)
Protein, ur: NEGATIVE mg/dL
Specific Gravity, Urine: 1.034 — ABNORMAL HIGH (ref 1.005–1.030)
Urobilinogen, UA: 0.2 mg/dL (ref 0.0–1.0)

## 2014-05-19 LAB — LACTATE DEHYDROGENASE: LDH: 750 U/L — ABNORMAL HIGH (ref 94–250)

## 2014-05-19 LAB — URIC ACID: URIC ACID, SERUM: 10.4 mg/dL — AB (ref 2.4–7.0)

## 2014-05-19 LAB — HEPATITIS B CORE ANTIBODY, TOTAL: Hep B Core Total Ab: NONREACTIVE

## 2014-05-19 LAB — HIV ANTIBODY (ROUTINE TESTING W REFLEX): HIV 1&2 Ab, 4th Generation: NONREACTIVE

## 2014-05-19 LAB — TROPONIN I: Troponin I: 1.43 ng/mL (ref <0.30)

## 2014-05-19 LAB — HEPATITIS B SURFACE ANTIBODY,QUALITATIVE: HEP B S AB: NEGATIVE

## 2014-05-19 MED ORDER — SODIUM CHLORIDE 0.9 % IV SOLN
INTRAVENOUS | Status: DC
  Administered 2014-05-19: 08:00:00 via INTRAVENOUS

## 2014-05-19 MED ORDER — LORAZEPAM 1 MG PO TABS
1.0000 mg | ORAL_TABLET | Freq: Two times a day (BID) | ORAL | Status: DC | PRN
  Administered 2014-05-19 (×2): 1 mg via ORAL
  Filled 2014-05-19 (×2): qty 1

## 2014-05-19 MED ORDER — LISINOPRIL 5 MG PO TABS: 5.0000 mg | ORAL_TABLET | Freq: Every day | ORAL | Status: DC

## 2014-05-19 MED ORDER — LORAZEPAM 1 MG PO TABS: 1.0000 mg | ORAL_TABLET | Freq: Two times a day (BID) | ORAL | Status: DC | PRN

## 2014-05-19 MED ORDER — LORAZEPAM 0.5 MG PO TABS
0.5000 mg | ORAL_TABLET | Freq: Once | ORAL | Status: AC
  Administered 2014-05-19: 0.5 mg via ORAL
  Filled 2014-05-19: qty 1

## 2014-05-19 MED ORDER — METOPROLOL TARTRATE 25 MG PO TABS
25.0000 mg | ORAL_TABLET | Freq: Three times a day (TID) | ORAL | Status: DC
  Administered 2014-05-19 (×2): 25 mg via ORAL
  Filled 2014-05-19 (×3): qty 1

## 2014-05-19 MED ORDER — IOHEXOL 300 MG/ML  SOLN
25.0000 mL | INTRAMUSCULAR | Status: AC
  Administered 2014-05-19: 25 mL via ORAL

## 2014-05-19 MED ORDER — IOHEXOL 300 MG/ML  SOLN: 25.0000 mL | INTRAMUSCULAR | Status: AC

## 2014-05-19 MED ORDER — LIDOCAINE HCL 1 % IJ SOLN
INTRAMUSCULAR | Status: AC
  Filled 2014-05-19: qty 10

## 2014-05-19 MED ORDER — IOHEXOL 300 MG/ML  SOLN
100.0000 mL | Freq: Once | INTRAMUSCULAR | Status: AC | PRN
  Administered 2014-05-19: 100 mL via INTRAVENOUS

## 2014-05-19 MED ORDER — ZOLPIDEM TARTRATE 5 MG PO TABS: 5.0000 mg | ORAL_TABLET | Freq: Every evening | ORAL | Status: DC | PRN

## 2014-05-19 MED ORDER — METOPROLOL TARTRATE 25 MG PO TABS: 25.0000 mg | ORAL_TABLET | Freq: Three times a day (TID) | ORAL | Status: DC

## 2014-05-19 NOTE — Discharge Summary (Signed)
Name: Cheryl Knox MRN: [id number] DOB: 02-12-1961 53 y.o. PCP: Rowe Clack, MD  Date of Admission: 05/18/2014  9:23 AM Date of Discharge: 05/19/2014 Attending Physician: Madilyn Fireman, MD  Discharge Diagnosis: 1. 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 Elevated troponin likely demand ischemia  3. Tachycardia  4. Elevated liver enzymes  5. Hypertenion 6. Dyslipidemia  7. Leukocytosis, resolved  8. Pleural effusions  9. QT prolongation  10. Insomnia/Anxiety   Discharge Medications:   Medication List    STOP taking these medications       predniSONE 5 MG Tabs tablet  Commonly known as:  STERAPRED UNI-PAK     sertraline 50 MG tablet  Commonly known as:  ZOLOFT      TAKE these medications       lisinopril 5 MG tablet  Commonly known as:  PRINIVIL,ZESTRIL  Take 1 tablet (5 mg total) by mouth daily.     LORazepam 1 MG tablet  Commonly known as:  ATIVAN  Take 1 tablet (1 mg total) by mouth 2 (two) times daily as needed for anxiety.     metoprolol tartrate 25 MG tablet  Commonly known as:  LOPRESSOR  Take 1 tablet (25 mg total) by mouth 3 (three) times daily.     zolpidem 5 MG tablet  Commonly known as:  AMBIEN  Take 1 tablet (5 mg total) by mouth at bedtime as needed for sleep.        Disposition and follow-up:   [name] was discharged from [facility] in stable condition.  At the hospital follow up visit please address:  1.   She needs biopsy of mediastinal mass Follow up CT abdomen/pelvis done at [facility]  2.  Labs / imaging needed at time of follow-up: none  3.  Pending labs/ test needing follow-up:  CT abdomen/pelvis   Follow-up Appointments: None    Discharge Instructions: None. This patient was transferred upon request of patient and Hematologist/Oncologist to [facility] for further care and work up.      Consultations: Treatment Team:  Rounding Lbcardiology, MD Heath Lark, MD-Hematology/Oncology  Ivin Poot, MD-Cardiothoracic surgery   Procedures Performed:  Dg Chest 2 View  05/18/2014   CLINICAL DATA:  Cough and tachycardia.  Lymphoma.  EXAM: CHEST  2 VIEW  COMPARISON:  None.  FINDINGS: Trachea is midline. The cardiomediastinal silhouette is enlarged. There is airspace opacification in the left perihilar region and left lung base with elevation of the left hemidiaphragm. Pleural thickening is seen posteriorly on the lateral view. Right lung is grossly clear.  IMPRESSION: 1. Left perihilar and left basilar airspace consolidation may be due to pneumonia. A centrally obstructing mass is another consideration. 2. Pleural thickening on the lateral view may be due to loculated pleural fluid. 3. Enlarged cardiomediastinal silhouette. Difficult to exclude adenopathy in this patient with a history lymphoma. 4. Patient is scheduled for CT chest later the same day.   Electronically Signed   By: Lorin Picket M.D.   On: 05/18/2014 11:18   Ct Angio Chest Pe W/cm &/or Wo Cm  05/18/2014   CLINICAL DATA:  Chest tightness.  History of lymphoma.  EXAM: CT ANGIOGRAPHY CHEST WITH CONTRAST  TECHNIQUE: Multidetector CT imaging of the chest was performed using the standard protocol during bolus administration of intravenous contrast. Multiplanar CT image reconstructions and MIPs were obtained to evaluate the vascular anatomy.  CONTRAST:  100 mL OMNIPAQUE IOHEXOL 350 MG/ML SOLN  COMPARISON:  PA and lateral chest earlier this same day.  FINDINGS: No pulmonary embolus is identified. The patient has a mass in the anterior mediastinum measuring at least 11.1 cm transverse by 6.5 cm AP by 8.7 cm craniocaudal. There are areas of necrosis within this mass lesion. Small axillary lymph nodes are identified. Index node on the left measures 1 cm in diameter on image 18 and index node on the right measures 0.7 cm on image 26. A  precarinal node measuring 1.6 cm is seen on image 42. A node anterior to the carina eccentric to the right on image 39 measures 2.4 x 2.5 cm.  The patient has a small right pleural effusion and a small pericardial effusion. A somewhat larger left pleural effusion has a lobulated appearance and is circumferential about the left lung worrisome for loculation.  Scattered airspace disease in the left lung has an appearance most compatible with compressive atelectasis secondary to pleural effusion. Mild atelectasis on the right is identified.  Visualized intra-abdominal contents are unremarkable. No focal bony abnormality is identified.  Review of the MIP images confirms the above findings.  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 Less likely differential considerations include invasive thymoma or germ cell tumor. The lesion does not appear to arise from the thyroid.  Loculated but small left pleural effusion.  Small right pleural effusion pericardial effusion appears simple.  Negative for pulmonary embolus.  Cardiomegaly.   Electronically Signed   By: Inge Rise M.D.   On: 05/18/2014 12:04    2D Echo:  LV EF: 55% - 60%  ------------------------------------------------------------------- Indications: Pericardial effusion 423.9.  ------------------------------------------------------------------- History: PMH: Pleural effusion. Tachycardia. Elevated troponin. Mediastinal mass. Risk factors: Hypertension.  ------------------------------------------------------------------- Study Conclusions  - Left ventricle: The cavity size was normal. There was moderate concentric hypertrophy. Systolic function was normal. The estimated ejection fraction was in the range of 55% to 60%. Wall motion was normal; there were no regional wall motion abnormalities. Features are consistent with a pseudonormal left ventricular filling pattern,  with concomitant abnormal relaxation and increased filling pressure (grade 2 diastolic dysfunction). Doppler parameters are consistent with high ventricular filling pressure. - Right ventricle: The cavity size was normal. Wall thickness was mildly increased. There does not appear to be any significant extrinsic compression on right ventricle from mediastinal mass. Systolic function was mildly reduced. - Pericardium, extracardiac: There was mild to moderate thickening of the pericardium especially noted along right ventricular free wall. A small to moderate pericardial effusion was identified circumferential to the heart. There was no evidence of hemodynamic compromise.  Transthoracic echocardiography. M-mode, complete 2D, spectral Doppler, and color Doppler. Birthdate: Patient birthdate: Feb 10, 1961. Age: Patient is 53 yr old. Sex: Gender: female. BMI: 29.7 kg/m^2. Blood pressure: 134/97 Patient status: Inpatient. Study date: Study date: 05/19/2014. Study time: 09:34 AM. Location: Bedside.  -------------------------------------------------------------------  ------------------------------------------------------------------- Left ventricle: The cavity size was normal. There was moderate concentric hypertrophy. Systolic function was normal. The estimated ejection fraction was in the range of 55% to 60%. Wall motion was normal; there were no regional wall motion abnormalities. Features are consistent with a pseudonormal left ventricular filling pattern, with concomitant abnormal relaxation and increased filling pressure (grade 2 diastolic dysfunction). Doppler parameters are consistent with high ventricular filling pressure.  ------------------------------------------------------------------- Aortic valve: Trileaflet; mildly thickened, mildly calcified leaflets. Mobility was not restricted. Doppler: Transvalvular velocity was within the normal range. There  was no stenosis. There was  no regurgitation.  ------------------------------------------------------------------- Aorta: Aortic root: The aortic root was normal in size.  ------------------------------------------------------------------- Mitral valve: Structurally normal valve. Mobility was not restricted. Doppler: Transvalvular velocity was within the normal range. There was no evidence for stenosis. There was no regurgitation. Peak gradient (D): 3 mm Hg.  ------------------------------------------------------------------- Left atrium: The atrium was normal in size.  ------------------------------------------------------------------- Right ventricle: The cavity size was normal. Wall thickness was mildly increased. Systolic function was mildly reduced.  ------------------------------------------------------------------- Pulmonic valve: Poorly visualized. Structurally normal valve. Cusp separation was normal. Doppler: Transvalvular velocity was within the normal range. There was no evidence for stenosis. There was no regurgitation.  ------------------------------------------------------------------- Tricuspid valve: Structurally normal valve. Doppler: Transvalvular velocity was within the normal range. There was trivial regurgitation.  ------------------------------------------------------------------- Pulmonary artery: The main pulmonary artery was normal-sized. Systolic pressure was within the normal range.  ------------------------------------------------------------------- Right atrium: The atrium was normal in size.  ------------------------------------------------------------------- Pericardium: There was mild to moderate thickening of the pericardium especially noted along right ventricular free wall. A small to moderate pericardial effusion was identified circumferential to the heart. There was no evidence of  hemodynamic compromise.  ------------------------------------------------------------------- Systemic veins: Inferior vena cava: The vessel was normal in size. The respirophasic diameter changes were in the normal range (>= 50%), consistent with normal central venous pressure.   Cardiac Cath: none   Admission HPI:  Chief Complaint: sob with exertion, palpitations  History of Present Illness:  53 y.o with history of NHL 15 years ago (1996) tx'ed with chemo x 2 years presents with sob with exertion, palpitations x 1 month. She went to an [place] urgent care clinic recently 04/16/14 and was diagnosed with bronchitis (without imaging) and given Prednisone, hydrocodeine cough syrup and Levoquin (x 2 separate weeks) to take but she still was not feeling well. She stopped the antibiotic because it made her dizzy and nauseated. She states she has had worsening shortness of breath especially with exertion x 6 weeks to the point where she has stopped her physical activity such as walking 3-7 miles within the last 2-3 weeks. She feels like she has not been able to take full deep breath only shallow breaths. She breaths better sitting up rather than lying down. She has had decreased ability to sleep at night due to breathing. Her shortness of breath is making her anxious as well. She started taking Zoloft on Saturday prior to admission to see if it would help with her symptoms.  She recently went to [facility] 04/21/14 to follow up her visit from the urgent care and they diagnosed her with anxiety which she does not feel she has and placed her on Zoloft.  Also at [facility] no imaging was obtained for the patients symptoms.    Review of Systems:  General: + fever in 04/16/2014, h/o chills, +night sweats, decreased appetite, decreased exercise tolerance due to sob, +weight loss (>40 lbs but she is exercising-used to weigh 213.4 6 months ago  HEENT: Denies h/a  Cardiac: denies chest pain/pleuritic  chest pain, +feeling like chest pressure/heaviness (constant)+palpitations (since 03/19/14)  Pulm: +dry cough (denies blood), sob with exertion  Abd: denies abdomen pain, diarrhea, constipation, denies GU blood, +nauseated x 1 week  Ext: denies lower extremity edema  Neuro: denies h/a, vision changes, denies syncope  Psych: anxious (worsening x 4 days) about being sob recently  Physical Exam:  On exam VS HR 124, 95-97% 20 148/107 (115) then 153/115 (122)  Blood pressure 143/109, pulse  117, temperature 97.7 F (36.5 C), temperature source Oral, resp. rate 24, height 5\' 4"  (1.626 m), weight 176 lb 6.4 oz (80.015 kg), SpO2 96.00%.  Vitals reviewed.  General: resting in bed, NAD, somewhat tearful on exam  HEENT: [place]/at, no scleral icterus, no cervical/axillary adenopathy  Cardiac: tachycardic, no rubs, murmurs or gallops  Pulm: clear to auscultation though slightly decreased at bases  Abd: soft, nontender, nondistended, BS present  Ext: warm and well perfused, no pedal edema  Neuro: alert and oriented X3, cranial nerves II-XII grossly intact   Hospital Course by problem list: 1. Anterior mediastinal mass 2. Elevated troponin likely demand ischemia  3. Tachycardia  4. Elevated liver enzymes  5. Hypertenion 6. Dyslipidemia  7. Leukocytosis, resolved  8. Pleural effusions  9. QT prolongation  10. Insomnia/Anxiety    53 y.o history of lymphoma (NHL) presented with sob with exertion, palpitations found to have anterior mediastinal mass on CT.   1. Anterior mediastinal mass  Anterior mediastinal mass is possibly lymphoma (likely with history of lymphoma in 1996 treated with chemotherapy only 1996 to 1998 without radiation) though other differentials include thymus tumor, germ cell, or thyroid mass. Hematology/oncology was consulted with recommendations of labs obtained and we started the patient on Allopurinol 300 mg daily and Normal saline 75 cc/hr for tumor lysis syndrome prevention.  Outpatient patient initially expressed interested in following with Dr. Ammie Dalton but wanted to be transferred to [facility] since she was treated there previously.  Interventional radiology was intially consulted for biopsy but hematology/oncology preferred cardiothoracic biopsy with CT surgery though CT surgery preferred IR biopsy.  WE will ultimately transfer the patient to [facility] for further biopsy and care as her case is complicated.  We did do a CT abdomen/pelvis to further work up her malignancy and echo to evaluate cardiac function and small pericardial effusion noted on imaging.  She had elevated LDH (750) and uric acid (10.4). She was given oxygen as needed for short of breath.     2. Elevated troponin likely demand ischemia  May be due to cardiac stress from anterior mediastinal mass. Troponins trended down to 1.43    Ref. Range 05/18/2014 09:40 05/18/2014 10:47 05/18/2014 16:15 05/18/2014 23:10 05/19/2014 06:40  Troponin I Latest Range: <0.30 ng/mL 0.89 (HH)  2.26 (HH) 2.02 (HH) 1.43 (HH)  Troponin i, poc Latest Range: 0.00-0.08 ng/mL  0.53 [facility])      Cardiology was consulted and think less likely acute coronary syndrome.  She was given Aspirin 325 mg x 1   3. Tachycardia  Heart rate was as high as 150s this admission but decreased to low 100s by discharge.  Could be related to anterior mass causing dyspnea leading to tachycardia. Normal tsh, free T4.   4. Elevated liver enzymes  Could be related to underlying malignancy.  We obtained a CT abdomen/pelvis, she was negative for hepatitis.    5. Hypertenion She was started on Lisinopril 5 mg daily, Lopressor 25 mg twice daily was increased to three times daily by cardiology.   6. Dyslipidemia  Lipid Panel    Component  Value  Date/Time    CHOL  229*  05/19/2014 0640    TRIG  122  05/19/2014 0640    HDL  43  05/19/2014 0640    CHOLHDL  5.3  05/19/2014 0640    VLDL  24  05/19/2014 0640    LDLCALC  162*  05/19/2014 0640    7. Leukocytosis, resolved   Noted on  admission but resolved.   8. Pleural effusions  Etiology could be secondary to underlying malignancy.    9. QT prolongation  QT 440>584>509>502. We avoided QT prolonging agents. She was given Magnesium 2 grams for Magnesium of 1.7 on admission.    10. Insomnia/Anxiety  Ambien 5 mg as needed and Ativan 1 mg bid as needed.  She has anxiety about current health issues and dysnea.  Previously recently started on Zoloft though she does not desire to continue that medication.   She was on Lovenox and compression devices for DVT prophylaxis.       Discharge Vitals:   BP 121/82  Pulse 107  Temp(Src) 98.6 F (37 C) (Oral)  Resp 16  Ht 5\' 4"  (1.626 m)  Wt 173 lb 12.8 oz (78.835 kg)  BMI 29.82 kg/m2  SpO2 96%  Discharge physical exam: Vitals reviewed.  General: resting in bed, NAD  HEENT: [place]/at, no scleral icterus  Cardiac:tachycardic, no rubs, murmurs or gallops  Pulm: clear to auscultation bilaterally, no wheezes, rales, or rhonchi  Abd: soft, nontender, nondistended, BS present  Ext: warm and well perfused, no pedal edema  Neuro: alert and oriented X3, cranial nerves II-XII grossly intact  Discharge Labs:  Results for orders placed during the hospital encounter of 05/18/14 (from the past 24 hour(s))  TROPONIN I     Status: Abnormal   Collection Time    05/18/14 11:10 PM      Result Value Ref Range   Troponin I 2.02 (*) <0.30 ng/mL  URINALYSIS, ROUTINE W REFLEX MICROSCOPIC     Status: Abnormal   Collection Time    05/19/14  6:20 AM      Result Value Ref Range   Color, Urine YELLOW  YELLOW   APPearance CLEAR  CLEAR   Specific Gravity, Urine 1.034 (*) 1.005 - 1.030   pH 5.0  5.0 - 8.0   Glucose, UA NEGATIVE  NEGATIVE mg/dL   Hgb urine dipstick NEGATIVE  NEGATIVE   Bilirubin Urine SMALL (*) NEGATIVE   Ketones, ur NEGATIVE  NEGATIVE mg/dL   Protein, ur NEGATIVE  NEGATIVE mg/dL   Urobilinogen, UA 0.2  0.0 - 1.0 mg/dL   Nitrite NEGATIVE  NEGATIVE   Leukocytes,  UA NEGATIVE  NEGATIVE  COMPREHENSIVE METABOLIC PANEL     Status: Abnormal   Collection Time    05/19/14  6:40 AM      Result Value Ref Range   Sodium 144  137 - 147 mEq/L   Potassium 4.2  3.7 - 5.3 mEq/L   Chloride 102  96 - 112 mEq/L   CO2 22  19 - 32 mEq/L   Glucose, Bld 83  70 - 99 mg/dL   BUN 19  6 - 23 mg/dL   Creatinine, Ser 0.73  0.50 - 1.10 mg/dL   Calcium 9.7  8.4 - 10.5 mg/dL   Total Protein 6.9  6.0 - 8.3 g/dL   Albumin 3.8  3.5 - 5.2 g/dL   AST 40 (*) 0 - 37 U/L   ALT 37 (*) 0 - 35 U/L   Alkaline Phosphatase 75  39 - 117 U/L   Total Bilirubin 0.4  0.3 - 1.2 mg/dL   GFR calc non Af Amer >90  >90 mL/min   GFR calc Af Amer >90  >90 mL/min   Anion gap 20 (*) 5 - 15  CBC WITH DIFFERENTIAL     Status: None   Collection Time    05/19/14  6:40 AM  Result Value Ref Range   WBC 10.3  4.0 - 10.5 K/uL   RBC 4.59  3.87 - 5.11 MIL/uL   Hemoglobin 13.8  12.0 - 15.0 g/dL   HCT 40.6  36.0 - 46.0 %   MCV 88.5  78.0 - 100.0 fL   MCH 30.1  26.0 - 34.0 pg   MCHC 34.0  30.0 - 36.0 g/dL   RDW 14.2  11.5 - 15.5 %   Platelets 270  150 - 400 K/uL   Neutrophils Relative % 61  43 - 77 %   Neutro Abs 6.3  1.7 - 7.7 K/uL   Lymphocytes Relative 29  12 - 46 %   Lymphs Abs 3.0  0.7 - 4.0 K/uL   Monocytes Relative 9  3 - 12 %   Monocytes Absolute 0.9  0.1 - 1.0 K/uL   Eosinophils Relative 1  0 - 5 %   Eosinophils Absolute 0.1  0.0 - 0.7 K/uL   Basophils Relative 0  0 - 1 %   Basophils Absolute 0.0  0.0 - 0.1 K/uL  LIPID PANEL     Status: Abnormal   Collection Time    05/19/14  6:40 AM      Result Value Ref Range   Cholesterol 229 (*) 0 - 200 mg/dL   Triglycerides 122  <150 mg/dL   HDL 43  >39 mg/dL   Total CHOL/HDL Ratio 5.3     VLDL 24  0 - 40 mg/dL   LDL Cholesterol 162 (*) 0 - 99 mg/dL  URIC ACID     Status: Abnormal   Collection Time    05/19/14  6:40 AM      Result Value Ref Range   Uric Acid, Serum 10.4 (*) 2.4 - 7.0 mg/dL  LACTATE DEHYDROGENASE     Status: Abnormal    Collection Time    05/19/14  6:40 AM      Result Value Ref Range   LDH 750 (*) 94 - 250 U/L  HEPATITIS B SURFACE ANTIBODY     Status: None   Collection Time    05/19/14  6:40 AM      Result Value Ref Range   Hep B S Ab NEGATIVE  NEGATIVE  HEPATITIS B SURFACE ANTIGEN     Status: None   Collection Time    05/19/14  6:40 AM      Result Value Ref Range   Hepatitis B Surface Ag NEGATIVE  NEGATIVE  HEPATITIS B CORE ANTIBODY, TOTAL     Status: None   Collection Time    05/19/14  6:40 AM      Result Value Ref Range   Hep B Core Total Ab NON REACTIVE  NON REACTIVE  HIV ANTIBODY (ROUTINE TESTING)     Status: None   Collection Time    05/19/14  6:40 AM      Result Value Ref Range   HIV 1&2 Ab, 4th Generation NONREACTIVE  NONREACTIVE  TROPONIN I     Status: Abnormal   Collection Time    05/19/14  6:40 AM      Result Value Ref Range   Troponin I 1.43 (*) <0.30 ng/mL   Results for Cheryl Knox, Cheryl Knox (MRN [id number]) as of 05/19/2014 17:18  Ref. Range 05/18/2014 09:40 05/18/2014 16:20 05/19/2014 06:40  Sodium Latest Range: 137-147 mEq/L 145  144  Potassium Latest Range: 3.7-5.3 mEq/L 3.9  4.2  Chloride Latest Range: 96-112 mEq/L 104  102  CO2 Latest Range: 19-32  mEq/L 21  22  BUN Latest Range: 6-23 mg/dL 18  19  Creatinine Latest Range: 0.50-1.10 mg/dL 0.76  0.73  Calcium Latest Range: 8.4-10.5 mg/dL 10.1  9.7  GFR calc non Af Amer Latest Range: >90 mL/min >90  >90  GFR calc Af Amer Latest Range: >90 mL/min >90  >90  Glucose Latest Range: 70-99 mg/dL 96  83  Anion gap Latest Range: 5-15  20 (H)  20 (H)  Magnesium Latest Range: 1.5-2.5 mg/dL  1.7   Alkaline Phosphatase Latest Range: 39-117 U/L 82  75  Albumin Latest Range: 3.5-5.2 g/dL 4.1  3.8  Uric Acid, Serum Latest Range: 2.4-7.0 mg/dL   10.4 (H)  AST Latest Range: 0-37 U/L 48 (H)  40 (H)  ALT Latest Range: 0-35 U/L 47 (H)  37 (H)  Total Protein Latest Range: 6.0-8.3 g/dL 7.7  6.9  Total Bilirubin Latest Range: 0.3-1.2 mg/dL 0.4  0.4    Results for GENIYAH, EISCHEID (MRN [id number]) as of 05/19/2014 17:18  Ref. Range 05/18/2014 09:40 05/18/2014 10:47 05/18/2014 16:15 05/18/2014 23:10 05/19/2014 06:40  LDH Latest Range: 94-250 U/L     750 (H)  Troponin I Latest Range: <0.30 ng/mL 0.89 (HH)  2.26 (HH) 2.02 (HH) 1.43 (HH)  Troponin i, poc Latest Range: 0.00-0.08 ng/mL  0.53 [facility])     Results for MINIYA, MIGUEZ (MRN [id number]) as of 05/19/2014 17:18  Ref. Range 05/19/2014 06:40  Cholesterol Latest Range: 0-200 mg/dL 229 (H)  Triglycerides Latest Range: <150 mg/dL 122  HDL Latest Range: >39 mg/dL 43  LDL (calc) Latest Range: 0-99 mg/dL 162 (H)  VLDL Latest Range: 0-40 mg/dL 24  Total CHOL/HDL Ratio No range found 5.3    Results for CALENA, SALEM (MRN [id number]) as of 05/19/2014 17:18  Ref. Range 05/18/2014 09:40 05/19/2014 06:40  WBC Latest Range: 4.0-10.5 K/uL 11.6 (H) 10.3  RBC Latest Range: 3.87-5.11 MIL/uL 4.94 4.59  Hemoglobin Latest Range: 12.0-15.0 g/dL 15.4 (H) 13.8  HCT Latest Range: 36.0-46.0 % 44.8 40.6  MCV Latest Range: 78.0-100.0 fL 90.7 88.5  MCH Latest Range: 26.0-34.0 pg 31.2 30.1  MCHC Latest Range: 30.0-36.0 g/dL 34.4 34.0  RDW Latest Range: 11.5-15.5 % 14.0 14.2  Platelets Latest Range: 150-400 K/uL 287 270  Neutrophils Relative % Latest Range: 43-77 % 61 61  Lymphocytes Relative Latest Range: 12-46 % 28 29  Monocytes Relative Latest Range: 3-12 % 9 9  Eosinophils Relative Latest Range: 0-5 % 1 1  Basophils Relative Latest Range: 0-1 % 1 0  NEUT# Latest Range: 1.7-7.7 K/uL 7.2 6.3  Lymphocytes Absolute Latest Range: 0.7-4.0 K/uL 3.3 3.0  Monocytes Absolute Latest Range: 0.1-1.0 K/uL 1.0 0.9  Eosinophils Absolute Latest Range: 0.0-0.7 K/uL 0.1 0.1  Basophils Absolute Latest Range: 0.0-0.1 K/uL 0.1 0.0   Results for DONYELLE, ENYEART (MRN [id number]) as of 05/19/2014 17:18  Ref. Range 05/18/2014 16:20  Prothrombin Time Latest Range: 11.6-15.2 seconds 14.2  INR Latest Range: 0.00-1.49  1.10   Results for LAYALI, FREUND (MRN [id number]) as of 05/19/2014 17:18  Ref. Range 05/18/2014 16:16 05/18/2014 16:20  TSH Latest Range: 0.350-4.500 uIU/mL 1.470   Free T4 Latest Range: 0.80-1.80 ng/dL  1.36   Results for ARIANNA, HAYDON (MRN [id number]) as of 05/19/2014 17:18  Ref. Range 05/19/2014 06:40  Hepatitis B Surface Ag Latest Range: NEGATIVE  NEGATIVE  Hep B S Ab Latest Range: NEGATIVE  NEGATIVE  Hep B Core Total Ab Latest Range: NON REACTIVE  NON REACTIVE  HIV  Latest Range: NONREACTIVE  NONREACTIVE   Results for GEORGIA, DELSIGNORE (MRN [id number]) as of 05/19/2014 17:18  Ref. Range 05/18/2014 11:50 05/19/2014 06:20  Color, Urine Latest Range: YELLOW  AMBER (A) YELLOW  APPearance Latest Range: CLEAR  CLEAR CLEAR  Specific Gravity, Urine Latest Range: 1.005-1.030  1.022 1.034 (H)  pH Latest Range: 5.0-8.0  6.0 5.0  Glucose Latest Range: NEGATIVE mg/dL NEGATIVE NEGATIVE  Bilirubin Urine Latest Range: NEGATIVE  SMALL (A) SMALL (A)  Ketones, ur Latest Range: NEGATIVE mg/dL 15 (A) NEGATIVE  Protein Latest Range: NEGATIVE mg/dL 30 (A) NEGATIVE  Urobilinogen, UA Latest Range: 0.0-1.0 mg/dL 0.2 0.2  Nitrite Latest Range: NEGATIVE  NEGATIVE NEGATIVE  Leukocytes, UA Latest Range: NEGATIVE  NEGATIVE NEGATIVE  Hgb urine dipstick Latest Range: NEGATIVE  NEGATIVE NEGATIVE  WBC, UA Latest Range: <3 WBC/hpf 0-2   RBC / HPF Latest Range: <3 RBC/hpf 0-2   Squamous Epithelial / LPF Latest Range: RARE  RARE   Crystals Latest Range: NEGATIVE  CA OXALATE CRYSTALS (A)      Signed: Cresenciano Genre, MD 05/19/2014, 6:03 PM    Services Ordered on Discharge: none Equipment Ordered on Discharge: none

## 2014-05-19 NOTE — Progress Notes (Signed)
Patient Name: Cheryl Knox Date of Encounter: 05/19/2014     Principal Problem:   Mediastinal mass Active Problems:   Elevated troponin   Tachycardia   Elevated liver enzymes   Pleural effusion   Leukocytosis, unspecified   HTN (hypertension)   Long QT interval    SUBJECTIVE  Denies any chest pain overnight, however continue to have SOB stating she can't seems to take in a deep breath. SOB worsening for weeks  CURRENT MEDS . allopurinol  300 mg Oral Daily  . enoxaparin (LOVENOX) injection  40 mg Subcutaneous Q24H  . iohexol  25 mL Oral Q1 Hr x 2  . lisinopril  5 mg Oral Daily  . metoprolol tartrate  25 mg Oral BID  . sodium chloride  3 mL Intravenous Q12H    OBJECTIVE  Filed Vitals:   05/18/14 2008 05/18/14 2201 05/18/14 2300 05/19/14 0615  BP: 145/110 111/72 124/88 134/97  Pulse: 86 45 100 110  Temp: 99.1 F (37.3 C) 98.9 F (37.2 C)  98.6 F (37 C)  TempSrc: Oral Oral  Oral  Resp: 22 20  18   Height:      Weight:    173 lb 12.8 oz (78.835 kg)  SpO2: 96% 95%  97%    Intake/Output Summary (Last 24 hours) at 05/19/14 0741 Last data filed at 05/19/14 0617  Gross per 24 hour  Intake    410 ml  Output    500 ml  Net    -90 ml   Filed Weights   05/18/14 1629 05/19/14 0615  Weight: 176 lb 6.4 oz (80.015 kg) 173 lb 12.8 oz (78.835 kg)    PHYSICAL EXAM  General: Pleasant, NAD. Neuro: Alert and oriented X 3. Moves all extremities spontaneously. Psych: Normal affect. HEENT:  Normal  Neck: Supple without bruits or JVD. Lungs:  Resp regular and unlabored, decreased breath sound on L Heart: tachycardic no s3, s4, or murmurs. Abdomen: Soft, non-tender, non-distended, BS + x 4.  Extremities: No clubbing, cyanosis or edema. DP/PT/Radials 2+ and equal bilaterally.  Accessory Clinical Findings  CBC  Recent Labs  05/18/14 0940  WBC 11.6*  NEUTROABS 7.2  HGB 15.4*  HCT 44.8  MCV 90.7  PLT 628   Basic Metabolic Panel  Recent Labs  05/18/14 0940  05/18/14 1620  NA 145  --   K 3.9  --   CL 104  --   CO2 21  --   GLUCOSE 96  --   BUN 18  --   CREATININE 0.76  --   CALCIUM 10.1  --   MG  --  1.7   Liver Function Tests  Recent Labs  05/18/14 0940  AST 48*  ALT 47*  ALKPHOS 82  BILITOT 0.4  PROT 7.7  ALBUMIN 4.1   Cardiac Enzymes  Recent Labs  05/18/14 0940 05/18/14 1615 05/18/14 2310  TROPONINI 0.89* 2.26* 2.02*   Thyroid Function Tests  Recent Labs  05/18/14 1616  TSH 1.470    TELE Sinus tach with HR high 90s-110s, no significant ventricular ectopy    ECG  Sinus tach with diffuse T wave inversion in all leads  Echocardiogram  Pending today    Radiology/Studies  Dg Chest 2 View  05/18/2014   CLINICAL DATA:  Cough and tachycardia.  Lymphoma.  EXAM: CHEST  2 VIEW  COMPARISON:  None.  FINDINGS: Trachea is midline. The cardiomediastinal silhouette is enlarged. There is airspace opacification in the left perihilar region and left lung  base with elevation of the left hemidiaphragm. Pleural thickening is seen posteriorly on the lateral view. Right lung is grossly clear.  IMPRESSION: 1. Left perihilar and left basilar airspace consolidation may be due to pneumonia. A centrally obstructing mass is another consideration. 2. Pleural thickening on the lateral view may be due to loculated pleural fluid. 3. Enlarged cardiomediastinal silhouette. Difficult to exclude adenopathy in this patient with a history lymphoma. 4. Patient is scheduled for CT chest later the same day.   Electronically Signed   By: Lorin Picket M.D.   On: 05/18/2014 11:18   Ct Angio Chest Pe W/cm &/or Wo Cm  05/18/2014   CLINICAL DATA:  Chest tightness.  History of lymphoma.  EXAM: CT ANGIOGRAPHY CHEST WITH CONTRAST  TECHNIQUE: Multidetector CT imaging of the chest was performed using the standard protocol during bolus administration of intravenous contrast. Multiplanar CT image reconstructions and MIPs were obtained to evaluate the vascular  anatomy.  CONTRAST:  100 mL OMNIPAQUE IOHEXOL 350 MG/ML SOLN  COMPARISON:  PA and lateral chest earlier this same day.  FINDINGS: No pulmonary embolus is identified. The patient has a mass in the anterior mediastinum measuring at least 11.1 cm transverse by 6.5 cm AP by 8.7 cm craniocaudal. There are areas of necrosis within this mass lesion. Small axillary lymph nodes are identified. Index node on the left measures 1 cm in diameter on image 18 and index node on the right measures 0.7 cm on image 26. A precarinal node measuring 1.6 cm is seen on image 42. A node anterior to the carina eccentric to the right on image 39 measures 2.4 x 2.5 cm.  The patient has a small right pleural effusion and a small pericardial effusion. A somewhat larger left pleural effusion has a lobulated appearance and is circumferential about the left lung worrisome for loculation.  Scattered airspace disease in the left lung has an appearance most compatible with compressive atelectasis secondary to pleural effusion. Mild atelectasis on the right is identified.  Visualized intra-abdominal contents are unremarkable. No focal bony abnormality is identified.  Review of the MIP images confirms the above findings.  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 Less likely differential considerations include invasive thymoma or germ cell tumor. The lesion does not appear to arise from the thyroid.  Loculated but small left pleural effusion.  Small right pleural effusion pericardial effusion appears simple.  Negative for pulmonary embolus.  Cardiomegaly.   Electronically Signed   By: Inge Rise M.D.   On: 05/18/2014 12:04    ASSESSMENT AND PLAN  1. Elevated troponin: likely demand ischemia  - troponin increased to 2.26 before dropping down to 2.02, per Dr Gillian Shields, still likely not ACS, but more likely related to tachycardia and possibly irritation from mass  - pending  echo 2. Dyspnea: may be related to #3 3. Large mediastinal mass  - TSH normal  - pending IR biopsy today  - pending CT of abdomen and pelvis today 4. H/o Non-hodgkin's lymphoma 5. HTN 6. Sinus tachycardia  - increase metoprolol to 25mg  TID  Signed, Almyra Deforest PA-C Pager: [phone] As above, patient seen and examined. She has some dyspnea but no chest pain. Her chest CT shows mediastinal mass and adenopathy. Her echocardiogram shows small to moderate pericardial effusion with final results pending. Elevated troponin is most likely secondary to pericardial effusion/myocarditis. No further ischemia evaluation is indicated at this point. She needs to proceed with biopsy  of mediastinal mass today and then further therapy at oncology's discretion. It may be worthwhile to perform a cardiac MRI to see if there is cardiac involvement but would like oncology's opinion. It may not change management. Kirk Ruths

## 2014-05-19 NOTE — Progress Notes (Signed)
Agree with medical student Sonny Dandy note see my note for more details.   Aundra Dubin MD

## 2014-05-19 NOTE — Progress Notes (Signed)
Subjective: Pt feels the same as yesterday.  She is feeling anxious about health issues.  Still hard to sleep lying flat at night.   Objective: Vital signs in last 24 hours: Filed Vitals:   05/18/14 2201 05/18/14 2300 05/19/14 0615 05/19/14 1100  BP: 111/72 124/88 134/97 135/94  Pulse: 45 100 110 117  Temp: 98.9 F (37.2 C)  98.6 F (37 C)   TempSrc: Oral  Oral   Resp: 20  18   Height:      Weight:   173 lb 12.8 oz (78.835 kg)   SpO2: 95%  97%    Weight change:   Intake/Output Summary (Last 24 hours) at 05/19/14 1336 Last data filed at 05/19/14 0617  Gross per 24 hour  Intake    410 ml  Output    500 ml  Net    -90 ml   Vitals reviewed. General: resting in bed, NAD HEENT: [place]/at, no scleral icterus Cardiac:tachycardic, no rubs, murmurs or gallops Pulm: clear to auscultation bilaterally, no wheezes, rales, or rhonchi Abd: soft, nontender, nondistended, BS present Ext: warm and well perfused, no pedal edema Neuro: alert and oriented X3, cranial nerves II-XII grossly intact  Lab Results: Basic Metabolic Panel:  Recent Labs Lab 05/18/14 0940 05/18/14 1620 05/19/14 0640  NA 145  --  144  K 3.9  --  4.2  CL 104  --  102  CO2 21  --  22  GLUCOSE 96  --  83  BUN 18  --  19  CREATININE 0.76  --  0.73  CALCIUM 10.1  --  9.7  MG  --  1.7  --    Liver Function Tests:  Recent Labs Lab 05/18/14 0940 05/19/14 0640  AST 48* 40*  ALT 47* 37*  ALKPHOS 82 75  BILITOT 0.4 0.4  PROT 7.7 6.9  ALBUMIN 4.1 3.8   CBC:  Recent Labs Lab 05/18/14 0940 05/19/14 0640  WBC 11.6* 10.3  NEUTROABS 7.2 6.3  HGB 15.4* 13.8  HCT 44.8 40.6  MCV 90.7 88.5  PLT 287 270   Cardiac Enzymes:  Recent Labs Lab 05/18/14 1615 05/18/14 2310 05/19/14 0640  TROPONINI 2.26* 2.02* 1.43*   Fasting Lipid Panel:  Recent Labs Lab 05/19/14 0640  CHOL 229*  HDL 43  LDLCALC 162*  TRIG 122  CHOLHDL 5.3   Thyroid Function Tests:  Recent Labs Lab 05/18/14 1616  05/18/14 1620  TSH 1.470  --   FREET4  --  1.36   Coagulation:  Recent Labs Lab 05/18/14 1620  LABPROT 14.2  INR 1.10   Urinalysis:  Recent Labs Lab 05/18/14 1150 05/19/14 0620  COLORURINE AMBER* YELLOW  LABSPEC 1.022 1.034*  PHURINE 6.0 5.0  GLUCOSEU NEGATIVE NEGATIVE  HGBUR NEGATIVE NEGATIVE  BILIRUBINUR SMALL* SMALL*  KETONESUR 15* NEGATIVE  PROTEINUR 30* NEGATIVE  UROBILINOGEN 0.2 0.2  NITRITE NEGATIVE NEGATIVE  LEUKOCYTESUR NEGATIVE NEGATIVE   Misc. Labs: HIV, hepatitis   Studies/Results: Dg Chest 2 View  05/18/2014   CLINICAL DATA:  Cough and tachycardia.  Lymphoma.  EXAM: CHEST  2 VIEW  COMPARISON:  None.  FINDINGS: Trachea is midline. The cardiomediastinal silhouette is enlarged. There is airspace opacification in the left perihilar region and left lung base with elevation of the left hemidiaphragm. Pleural thickening is seen posteriorly on the lateral view. Right lung is grossly clear.  IMPRESSION: 1. Left perihilar and left basilar airspace consolidation may be due to pneumonia. A centrally obstructing mass is another consideration. 2.  Pleural thickening on the lateral view may be due to loculated pleural fluid. 3. Enlarged cardiomediastinal silhouette. Difficult to exclude adenopathy in this patient with a history lymphoma. 4. Patient is scheduled for CT chest later the same day.   Electronically Signed   By: Lorin Picket M.D.   On: 05/18/2014 11:18   Ct Angio Chest Pe W/cm &/or Wo Cm  05/18/2014   CLINICAL DATA:  Chest tightness.  History of lymphoma.  EXAM: CT ANGIOGRAPHY CHEST WITH CONTRAST  TECHNIQUE: Multidetector CT imaging of the chest was performed using the standard protocol during bolus administration of intravenous contrast. Multiplanar CT image reconstructions and MIPs were obtained to evaluate the vascular anatomy.  CONTRAST:  100 mL OMNIPAQUE IOHEXOL 350 MG/ML SOLN  COMPARISON:  PA and lateral chest earlier this same day.  FINDINGS: No pulmonary  embolus is identified. The patient has a mass in the anterior mediastinum measuring at least 11.1 cm transverse by 6.5 cm AP by 8.7 cm craniocaudal. There are areas of necrosis within this mass lesion. Small axillary lymph nodes are identified. Index node on the left measures 1 cm in diameter on image 18 and index node on the right measures 0.7 cm on image 26. A precarinal node measuring 1.6 cm is seen on image 42. A node anterior to the carina eccentric to the right on image 39 measures 2.4 x 2.5 cm.  The patient has a small right pleural effusion and a small pericardial effusion. A somewhat larger left pleural effusion has a lobulated appearance and is circumferential about the left lung worrisome for loculation.  Scattered airspace disease in the left lung has an appearance most compatible with compressive atelectasis secondary to pleural effusion. Mild atelectasis on the right is identified.  Visualized intra-abdominal contents are unremarkable. No focal bony abnormality is identified.  Review of the MIP images confirms the above findings.  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 Less likely differential considerations include invasive thymoma or germ cell tumor. The lesion does not appear to arise from the thyroid.  Loculated but small left pleural effusion.  Small right pleural effusion pericardial effusion appears simple.  Negative for pulmonary embolus.  Cardiomegaly.   Electronically Signed   By: Inge Rise M.D.   On: 05/18/2014 12:04   Medications: Scheduled Meds: . allopurinol  300 mg Oral Daily  . enoxaparin (LOVENOX) injection  40 mg Subcutaneous Q24H  . iohexol  25 mL Oral Q1 Hr x 2  . lidocaine      . lisinopril  5 mg Oral Daily  . metoprolol tartrate  25 mg Oral TID  . sodium chloride  3 mL Intravenous Q12H   Continuous Infusions: . sodium chloride 75 mL/hr at 05/19/14 0800   PRN Meds:.LORazepam,  zolpidem Assessment/Plan: 53 y.o history of NHL presents with sob with exertion, palpitations found to have mediastinal mass on CT   #Mediastinal mass  -Anterior mediastinal mass is possibly lymphoma (likely with history of lymphoma in 1996) though other ddx thymus tumor, germ cell, or thyroid mass.  -Hematology/oncology consulted with recommendations. Outpatient patient wants to follow with Dr. Ammie Dalton  -IR consulted for biopsy but H/O indicated 05/19/14 that they would like CTS to do biopsy.  Pending consult from CT surgery so will hold CT guided biopsy -will do CT ab/pelvis to further w/u, pending echo to evaluate cardiac function and pericardial effusion  -elevated LDH and uric acid. On Allopurinol 300 mg qd by H/O  and NS 75 cc/hr  -pending lymphocyte flow cytometry, HIV, hepatitis  -Oxygen prn if short of breath   #Elevated troponin likely demand ischemia  -May be due to cardiac stress from anterior mass. Trops trended down to 1.43  -Cardiology consulted they recommend echo and will follow  -Aspirin 325 mg x 1   #Tachycardia  -could be related to anterior mass causing sob leading to tachycardia  -wnl tsh, free T4  -will monitor on telemetry   #Elevated liver enzymes  -could be related to underlying malignancy  -pending CT ab/pelvis, pending hepatitis labs ordered by H/O  -will trend CMET   #HTN  -Cardiology started Lisinopril 5 mg qd, Lopressor 25 mg tid    #dyslipidemia  Lipid Panel     Component Value Date/Time   CHOL 229* 05/19/2014 0640   TRIG 122 05/19/2014 0640   HDL 43 05/19/2014 0640   CHOLHDL 5.3 05/19/2014 0640   VLDL 24 05/19/2014 0640   LDLCALC 162* 05/19/2014 0640     #Leukocytosis, resolved    #Pleural effusions  -Etiology could be secondary to underlying malignancy   #QT prolongation  -QT 584 on repeat EKG trending down -will trend EKG in the am   #insomnia/Anxiety  -Ambien prn, Ativan 1 mg bid prn   #F/E/N  -NS 75 cc/hr  -labs in am  -cardiac diet    #DVT px  -Lovenox, scds   Dispo: Disposition is deferred at this time, awaiting improvement of current medical problems.  Anticipated discharge in approximately 2-3 day(s).   The patient does not have a current PCP and will establish Rowe Clack, MD)  The patient does not have transportation limitations that hinder transportation to clinic appointments.  .Services Needed at time of discharge: Y = Yes, Blank = No PT:   OT:   RN:   Equipment:   Other:     LOS: 1 day   Cresenciano Genre, MD [phone] 05/19/2014, 1:36 PM

## 2014-05-19 NOTE — Progress Notes (Signed)
  Subjective: Patient examined, CT scan of chest and echocardiogram reviewed 53 year old Caucasian female nonsmoker admitted with progressive shortness of breath and a large mediastinal mass with extrinsic compression of the trachea. No clinical symptoms of SVC syndrome or Horner's syndrome. Patient has prior history of non-Hodgkin's lymphoma and had left Chamberlain procedure for biopsy of mediastinal mass in 1996. She essentially treated with multiple chemotherapy agents over the following 2 years--no radiation therapy was used. She is treated [facility]. She apparently has been in remission.  The patient is being staged with CT of the abdomen pelvis. IR core needle biopsy of the mediastinal mass is planned. If the mass is shown to be lymphoma and additional tissue is needed for markers then repeat Chamberlain procedure-open anterior thoracotomy could be performed. The patient is currently breathing comfortably lying supine but following general anesthesia and thoracotomy she may become ventilator dependent because of the airway displacement from the tumor.  Her echocardiogram demonstrates normal LV systolic function, LVH, small pericardial effusion less than 1 cm  Objective: Vital signs in last 24 hours: Temp:  [98.4 F (36.9 C)-99.1 F (37.3 C)] 98.6 F (37 C) (09/03 0615) Pulse Rate:  [45-117] 107 (09/03 1411) Cardiac Rhythm:  [-] Sinus tachycardia (09/03 0800) Resp:  [16-24] 16 (09/03 1411) BP: (111-146)/(72-110) 121/82 mmHg (09/03 1411) SpO2:  [95 %-97 %] 96 % (09/03 1411) Weight:  [173 lb 12.8 oz (78.835 kg)-176 lb 6.4 oz (80.015 kg)] 173 lb 12.8 oz (78.835 kg) (09/03 0615)  Hemodynamic parameters for last 24 hours:   sinus tachycardia  Intake/Output from previous day: 09/02 0701 - 09/03 0700 In: 410 [P.O.:360; IV Piggyback:50] Out: 500 [Urine:500] Intake/Output this shift:    Exam Middle-aged female lying in bed no acute distress but anxious HEENT normocephalic  pupils equal dentition good Neck without JVD or palpable mass, no palpable nodes Breath sounds distant, left medial anterior thoracotomy scar present Heart regular rapid without rub or gallop Abdomen soft without palpable mass Extremities without cyanosis clubbing or edema Vascular palpable pulses in the extremities Neuro no focal motor deficit  Lab Results:  Recent Labs  05/18/14 0940 05/19/14 0640  WBC 11.6* 10.3  HGB 15.4* 13.8  HCT 44.8 40.6  PLT 287 270   BMET:  Recent Labs  05/18/14 0940 05/19/14 0640  NA 145 144  K 3.9 4.2  CL 104 102  CO2 21 22  GLUCOSE 96 83  BUN 18 19  CREATININE 0.76 0.73  CALCIUM 10.1 9.7    PT/INR:  Recent Labs  05/18/14 1620  LABPROT 14.2  INR 1.10   ABG No results found for this basename: phart, pco2, po2, hco3, tco2, acidbasedef, o2sat   CBG (last 3)  No results found for this basename: GLUCAP,  in the last 72 hours  Assessment/Plan: S/P   Large recurrent mediastinal mass Proceed with IR biopsy under local anesthesia-- If further tissue is  needed for diagnosis or to direct therapy then thoracotomy with biopsy under general anesthesia can be performed but  the patient may become ventilator dependent.   LOS: 1 day    VAN TRIGT III,Konstantina Nachreiner 05/19/2014

## 2014-05-19 NOTE — Progress Notes (Signed)
UR Completed Rector Devonshire Graves-Bigelow, RN,BSN [phone]  

## 2014-05-19 NOTE — ED Provider Notes (Signed)
Medical screening examination/treatment/procedure(s) were performed by non-physician practitioner and as supervising physician I was immediately available for consultation/collaboration.   EKG Interpretation   Date/Time:  Wednesday May 18 2014 09:19:40 EDT Ventricular Rate:  126 PR Interval:  132 QRS Duration: 74 QT Interval:  304 QTC Calculation: 440 R Axis:   4 Text Interpretation:  Sinus tachycardia Low voltage QRS Cannot rule out  Anterior infarct , age undetermined T wave abnormality, consider  inferolateral ischemia Abnormal ECG wandering baseline No previous ECGs  available Confirmed by Broc Caspers  MD, Madisyn Mawhinney ([id number]) on 05/18/2014 9:31:21 AM       Fredia Sorrow, MD 05/19/14 1102

## 2014-05-19 NOTE — H&P (Signed)
INTERNAL MEDICINE TEACHING ATTENDING NOTE  Day 1 of stay  Patient name: Cheryl Knox  MRN: [id number] Date of birth: Mar 13, 1961   Key clinical points and exam                                                          53 y.o.with past medical history of NHL (mass in the anterior mediastinum in 1996), treated in remission - presenting with a month of exertional shortness of breath, and found to have a new mediastinal mass in the anterior mediastinum. She reports losing 46 lbs over 6 months but she was also trying to lose weight. Her last treatment was at [facility]. I met the patient today, she is anxious and nervous, at times appears teary. Night sweats - unsure, patient reports feeling hot.   General: She is other wise in room air, in no acute distress, can speak in paragraphs. She can lie down comfortably, with no shortness of breath. She does not have any chest pain. Her vitals are stable.  HEENT: PERRL, EOMI, no scleral icterus. Heart: RRR, no rubs, murmurs or gallops. Lungs: Minor rales at the left base. Diminished breath sounds bilaterally.  Abdomen: Soft, nontender, nondistended, BS present. Extremities: Warm, no pedal edema. Neuro: Alert and oriented X3, cranial nerves II-XII grossly intact,  strength and sensation to light touch equal in bilateral upper and lower extremities  I have reviewed the chart, lab results, EKG, imaging and relevant notes of this patient. Her troponins spiked to 2.26, now trending down to 1.43.  Assessment and Plan                                                                       Mediastinal Mass - likely recurrence of lymphoma. CT abdomen Pelvis and biopsy of the mass today. Appreciate oncology recs.   Type 2 NSTEMI - likely due to the compressing mass. 2D echo done, EF 77-03%, grade 2 diastolic dysfunction, however the patient does not appear to have any overt signs or symptoms of heart failure. No evidence of hemodynamic compromise.   Agree with Dr  [facility] assessment and plan as per her HP note.   I have seen and evaluated this patient and discussed it with my IM resident team - Dr Aundra Dubin, [place] and [place].  Please see the rest of the plan per resident note from today.   [place], [place] 05/19/2014, 12:49 PM.

## 2014-05-19 NOTE — H&P (Signed)
Cheryl Knox is an 53 y.o. female.   Chief Complaint: Pt admitted yesterday through ED Shortness of breath; weakness; tachycardia x 1 mo Work up reveals large mediastinal mass and lymphadenopathy Elevated troponin: likely demand ischemia per Cardiology Pt has hx Non Hodgkin's Lymphoma Request from Oncology MD for IR consult for mediastinal mass bx- possible recurrence NHL Dr Anselm Pancoast  Has reviewed films and chart I have seen and examined pt Now scheduled for same  HPI: NHL; SOB  Past Medical History  Diagnosis Date  . Non Hodgkin's lymphoma     a. Dx 1996, tx with chemotherapy x 2 years at [facility]  . Pericardial effusion     a. at time of NHL diagnosis, s/p tube placement per patient.  . Shortness of breath   . Mass of mediastinum 05/2014    Past Surgical History  Procedure Laterality Date  . Port a cath placement      Family History  Problem Relation Age of Onset  . Valvular heart disease Mother   . Depression Father     deceased ? cause   . Cancer Mother     breast still living age 85 y.o    Social History:  reports that she quit smoking about 9 years ago. Her smoking use included Cigarettes. She smoked 0.00 packs per day. She has never used smokeless tobacco. She reports that she does not drink alcohol or use illicit drugs.  Allergies: No Known Allergies  Medications Prior to Admission  Medication Sig Dispense Refill  . predniSONE (STERAPRED UNI-PAK) 5 MG TABS tablet Take by mouth as directed. Taper as directed for 10 days. Starting 8/29      . sertraline (ZOLOFT) 50 MG tablet Take 25 mg by mouth daily.        Results for orders placed during the hospital encounter of 05/18/14 (from the past 48 hour(s))  COMPREHENSIVE METABOLIC PANEL     Status: Abnormal   Collection Time    05/18/14  9:40 AM      Result Value Ref Range   Sodium 145  137 - 147 mEq/L   Potassium 3.9  3.7 - 5.3 mEq/L   Chloride 104  96 - 112 mEq/L   CO2 21  19 - 32 mEq/L   Glucose, Bld 96  70 - 99  mg/dL   BUN 18  6 - 23 mg/dL   Creatinine, Ser 0.76  0.50 - 1.10 mg/dL   Calcium 10.1  8.4 - 10.5 mg/dL   Total Protein 7.7  6.0 - 8.3 g/dL   Albumin 4.1  3.5 - 5.2 g/dL   AST 48 (*) 0 - 37 U/L   ALT 47 (*) 0 - 35 U/L   Alkaline Phosphatase 82  39 - 117 U/L   Total Bilirubin 0.4  0.3 - 1.2 mg/dL   GFR calc non Af Amer >90  >90 mL/min   GFR calc Af Amer >90  >90 mL/min   Comment: (NOTE)     The eGFR has been calculated using the CKD EPI equation.     This calculation has not been validated in all clinical situations.     eGFR's persistently <90 mL/min signify possible Chronic Kidney     Disease.   Anion gap 20 (*) 5 - 15  CBC WITH DIFFERENTIAL     Status: Abnormal   Collection Time    05/18/14  9:40 AM      Result Value Ref Range   WBC 11.6 (*) 4.0 -  10.5 K/uL   RBC 4.94  3.87 - 5.11 MIL/uL   Hemoglobin 15.4 (*) 12.0 - 15.0 g/dL   HCT 44.8  36.0 - 46.0 %   MCV 90.7  78.0 - 100.0 fL   MCH 31.2  26.0 - 34.0 pg   MCHC 34.4  30.0 - 36.0 g/dL   RDW 14.0  11.5 - 15.5 %   Platelets 287  150 - 400 K/uL   Neutrophils Relative % 61  43 - 77 %   Neutro Abs 7.2  1.7 - 7.7 K/uL   Lymphocytes Relative 28  12 - 46 %   Lymphs Abs 3.3  0.7 - 4.0 K/uL   Monocytes Relative 9  3 - 12 %   Monocytes Absolute 1.0  0.1 - 1.0 K/uL   Eosinophils Relative 1  0 - 5 %   Eosinophils Absolute 0.1  0.0 - 0.7 K/uL   Basophils Relative 1  0 - 1 %   Basophils Absolute 0.1  0.0 - 0.1 K/uL  TROPONIN I     Status: Abnormal   Collection Time    05/18/14  9:40 AM      Result Value Ref Range   Troponin I 0.89 (*) <0.30 ng/mL   Comment:            Due to the release kinetics of cTnI,     a negative result within the first hours     of the onset of symptoms does not rule out     myocardial infarction with certainty.     If myocardial infarction is still suspected,     repeat the test at appropriate intervals.     CRITICAL RESULT CALLED TO, READ BACK BY AND VERIFIED WITH:     THOMPSON A RN 05/18/14 0940  COSTELLO B  I-STAT TROPOININ, ED     Status: Abnormal   Collection Time    05/18/14 10:47 AM      Result Value Ref Range   Troponin i, poc 0.53 (*) 0.00 - 0.08 ng/mL   Comment NOTIFIED PHYSICIAN     Comment 3            Comment: Due to the release kinetics of cTnI,     a negative result within the first hours     of the onset of symptoms does not rule out     myocardial infarction with certainty.     If myocardial infarction is still suspected,     repeat the test at appropriate intervals.  URINALYSIS, ROUTINE W REFLEX MICROSCOPIC     Status: Abnormal   Collection Time    05/18/14 11:50 AM      Result Value Ref Range   Color, Urine AMBER (*) YELLOW   Comment: BIOCHEMICALS MAY BE AFFECTED BY COLOR   APPearance CLEAR  CLEAR   Specific Gravity, Urine 1.022  1.005 - 1.030   pH 6.0  5.0 - 8.0   Glucose, UA NEGATIVE  NEGATIVE mg/dL   Hgb urine dipstick NEGATIVE  NEGATIVE   Bilirubin Urine SMALL (*) NEGATIVE   Ketones, ur 15 (*) NEGATIVE mg/dL   Protein, ur 30 (*) NEGATIVE mg/dL   Urobilinogen, UA 0.2  0.0 - 1.0 mg/dL   Nitrite NEGATIVE  NEGATIVE   Leukocytes, UA NEGATIVE  NEGATIVE  URINE MICROSCOPIC-ADD ON     Status: Abnormal   Collection Time    05/18/14 11:50 AM      Result Value Ref Range   Squamous Epithelial /  LPF RARE  RARE   WBC, UA 0-2  <3 WBC/hpf   RBC / HPF 0-2  <3 RBC/hpf   Crystals CA OXALATE CRYSTALS (*) NEGATIVE  TROPONIN I     Status: Abnormal   Collection Time    05/18/14  4:15 PM      Result Value Ref Range   Troponin I 2.26 (*) <0.30 ng/mL   Comment:            Due to the release kinetics of cTnI,     a negative result within the first hours     of the onset of symptoms does not rule out     myocardial infarction with certainty.     If myocardial infarction is still suspected,     repeat the test at appropriate intervals.     CRITICAL VALUE NOTED.  VALUE IS CONSISTENT WITH PREVIOUSLY REPORTED AND CALLED VALUE.  TSH     Status: None   Collection Time     05/18/14  4:16 PM      Result Value Ref Range   TSH 1.470  0.350 - 4.500 uIU/mL  T4, FREE     Status: None   Collection Time    05/18/14  4:20 PM      Result Value Ref Range   Free T4 1.36  0.80 - 1.80 ng/dL   Comment: Performed at [facility]     Status: None   Collection Time    05/18/14  4:20 PM      Result Value Ref Range   Prothrombin Time 14.2  11.6 - 15.2 seconds   INR 1.10  0.00 - 1.49  MAGNESIUM     Status: None   Collection Time    05/18/14  4:20 PM      Result Value Ref Range   Magnesium 1.7  1.5 - 2.5 mg/dL  TROPONIN I     Status: Abnormal   Collection Time    05/18/14 11:10 PM      Result Value Ref Range   Troponin I 2.02 (*) <0.30 ng/mL   Comment:            Due to the release kinetics of cTnI,     a negative result within the first hours     of the onset of symptoms does not rule out     myocardial infarction with certainty.     If myocardial infarction is still suspected,     repeat the test at appropriate intervals.     CRITICAL VALUE NOTED.  VALUE IS CONSISTENT WITH PREVIOUSLY REPORTED AND CALLED VALUE.  URINALYSIS, ROUTINE W REFLEX MICROSCOPIC     Status: Abnormal   Collection Time    05/19/14  6:20 AM      Result Value Ref Range   Color, Urine YELLOW  YELLOW   APPearance CLEAR  CLEAR   Specific Gravity, Urine 1.034 (*) 1.005 - 1.030   pH 5.0  5.0 - 8.0   Glucose, UA NEGATIVE  NEGATIVE mg/dL   Hgb urine dipstick NEGATIVE  NEGATIVE   Bilirubin Urine SMALL (*) NEGATIVE   Ketones, ur NEGATIVE  NEGATIVE mg/dL   Protein, ur NEGATIVE  NEGATIVE mg/dL   Urobilinogen, UA 0.2  0.0 - 1.0 mg/dL   Nitrite NEGATIVE  NEGATIVE   Leukocytes, UA NEGATIVE  NEGATIVE   Comment: MICROSCOPIC NOT DONE ON URINES WITH NEGATIVE PROTEIN, BLOOD, LEUKOCYTES, NITRITE, OR GLUCOSE <1000 mg/dL.  COMPREHENSIVE METABOLIC PANEL  Status: Abnormal   Collection Time    05/19/14  6:40 AM      Result Value Ref Range   Sodium 144  137 - 147 mEq/L    Potassium 4.2  3.7 - 5.3 mEq/L   Chloride 102  96 - 112 mEq/L   CO2 22  19 - 32 mEq/L   Glucose, Bld 83  70 - 99 mg/dL   BUN 19  6 - 23 mg/dL   Creatinine, Ser 0.73  0.50 - 1.10 mg/dL   Calcium 9.7  8.4 - 10.5 mg/dL   Total Protein 6.9  6.0 - 8.3 g/dL   Albumin 3.8  3.5 - 5.2 g/dL   AST 40 (*) 0 - 37 U/L   ALT 37 (*) 0 - 35 U/L   Alkaline Phosphatase 75  39 - 117 U/L   Total Bilirubin 0.4  0.3 - 1.2 mg/dL   GFR calc non Af Amer >90  >90 mL/min   GFR calc Af Amer >90  >90 mL/min   Comment: (NOTE)     The eGFR has been calculated using the CKD EPI equation.     This calculation has not been validated in all clinical situations.     eGFR's persistently <90 mL/min signify possible Chronic Kidney     Disease.   Anion gap 20 (*) 5 - 15  CBC WITH DIFFERENTIAL     Status: None   Collection Time    05/19/14  6:40 AM      Result Value Ref Range   WBC 10.3  4.0 - 10.5 K/uL   RBC 4.59  3.87 - 5.11 MIL/uL   Hemoglobin 13.8  12.0 - 15.0 g/dL   HCT 40.6  36.0 - 46.0 %   MCV 88.5  78.0 - 100.0 fL   MCH 30.1  26.0 - 34.0 pg   MCHC 34.0  30.0 - 36.0 g/dL   RDW 14.2  11.5 - 15.5 %   Platelets 270  150 - 400 K/uL   Neutrophils Relative % 61  43 - 77 %   Neutro Abs 6.3  1.7 - 7.7 K/uL   Lymphocytes Relative 29  12 - 46 %   Lymphs Abs 3.0  0.7 - 4.0 K/uL   Monocytes Relative 9  3 - 12 %   Monocytes Absolute 0.9  0.1 - 1.0 K/uL   Eosinophils Relative 1  0 - 5 %   Eosinophils Absolute 0.1  0.0 - 0.7 K/uL   Basophils Relative 0  0 - 1 %   Basophils Absolute 0.0  0.0 - 0.1 K/uL  LIPID PANEL     Status: Abnormal   Collection Time    05/19/14  6:40 AM      Result Value Ref Range   Cholesterol 229 (*) 0 - 200 mg/dL   Triglycerides 122  <150 mg/dL   HDL 43  >39 mg/dL   Total CHOL/HDL Ratio 5.3     VLDL 24  0 - 40 mg/dL   LDL Cholesterol 162 (*) 0 - 99 mg/dL   Comment:            Total Cholesterol/HDL:CHD Risk     Coronary Heart Disease Risk Table                         Men   Women       1/2 Average Risk   3.4   3.3      Average Risk  5.0   4.4      2 X Average Risk   9.6   7.1      3 X Average Risk  23.4   11.0                Use the calculated Patient Ratio     above and the CHD Risk Table     to determine the patient's CHD Risk.                ATP III CLASSIFICATION (LDL):      <100     mg/dL   Optimal      100-129  mg/dL   Near or Above                        Optimal      130-159  mg/dL   Borderline      160-189  mg/dL   High      >190     mg/dL   Very High  URIC ACID     Status: Abnormal   Collection Time    05/19/14  6:40 AM      Result Value Ref Range   Uric Acid, Serum 10.4 (*) 2.4 - 7.0 mg/dL  LACTATE DEHYDROGENASE     Status: Abnormal   Collection Time    05/19/14  6:40 AM      Result Value Ref Range   LDH 750 (*) 94 - 250 U/L   Comment: HEMOLYSIS AT THIS LEVEL MAY AFFECT RESULT  TROPONIN I     Status: Abnormal   Collection Time    05/19/14  6:40 AM      Result Value Ref Range   Troponin I 1.43 (*) <0.30 ng/mL   Comment:            Due to the release kinetics of cTnI,     a negative result within the first hours     of the onset of symptoms does not rule out     myocardial infarction with certainty.     If myocardial infarction is still suspected,     repeat the test at appropriate intervals.     CRITICAL VALUE NOTED.  VALUE IS CONSISTENT WITH PREVIOUSLY REPORTED AND CALLED VALUE.   Dg Chest 2 View  05/18/2014   CLINICAL DATA:  Cough and tachycardia.  Lymphoma.  EXAM: CHEST  2 VIEW  COMPARISON:  None.  FINDINGS: Trachea is midline. The cardiomediastinal silhouette is enlarged. There is airspace opacification in the left perihilar region and left lung base with elevation of the left hemidiaphragm. Pleural thickening is seen posteriorly on the lateral view. Right lung is grossly clear.  IMPRESSION: 1. Left perihilar and left basilar airspace consolidation may be due to pneumonia. A centrally obstructing mass is another consideration. 2. Pleural  thickening on the lateral view may be due to loculated pleural fluid. 3. Enlarged cardiomediastinal silhouette. Difficult to exclude adenopathy in this patient with a history lymphoma. 4. Patient is scheduled for CT chest later the same day.   Electronically Signed   By: Lorin Picket M.D.   On: 05/18/2014 11:18   Ct Angio Chest Pe W/cm &/or Wo Cm  05/18/2014   CLINICAL DATA:  Chest tightness.  History of lymphoma.  EXAM: CT ANGIOGRAPHY CHEST WITH CONTRAST  TECHNIQUE: Multidetector CT imaging of the chest was performed using the standard protocol during bolus administration of intravenous contrast. Multiplanar CT image reconstructions and  MIPs were obtained to evaluate the vascular anatomy.  CONTRAST:  100 mL OMNIPAQUE IOHEXOL 350 MG/ML SOLN  COMPARISON:  PA and lateral chest earlier this same day.  FINDINGS: No pulmonary embolus is identified. The patient has a mass in the anterior mediastinum measuring at least 11.1 cm transverse by 6.5 cm AP by 8.7 cm craniocaudal. There are areas of necrosis within this mass lesion. Small axillary lymph nodes are identified. Index node on the left measures 1 cm in diameter on image 18 and index node on the right measures 0.7 cm on image 26. A precarinal node measuring 1.6 cm is seen on image 42. A node anterior to the carina eccentric to the right on image 39 measures 2.4 x 2.5 cm.  The patient has a small right pleural effusion and a small pericardial effusion. A somewhat larger left pleural effusion has a lobulated appearance and is circumferential about the left lung worrisome for loculation.  Scattered airspace disease in the left lung has an appearance most compatible with compressive atelectasis secondary to pleural effusion. Mild atelectasis on the right is identified.  Visualized intra-abdominal contents are unremarkable. No focal bony abnormality is identified.  Review of the MIP images confirms the above findings.  IMPRESSION: Large anterior mediastinal mass with  extensive mediastinal adenopathy is most likely due to lymphoma. Less likely differential considerations include invasive thymoma or germ cell tumor. The lesion does not appear to arise from the thyroid.  Loculated but small left pleural effusion.  Small right pleural effusion pericardial effusion appears simple.  Negative for pulmonary embolus.  Cardiomegaly.   Electronically Signed   By: Inge Rise M.D.   On: 05/18/2014 12:04    Review of Systems  Constitutional: Positive for weight loss and diaphoresis. Negative for fever.  Eyes: Negative for blurred vision.  Respiratory: Positive for cough, sputum production and shortness of breath.   Cardiovascular: Positive for chest pain.  Gastrointestinal: Negative for nausea, vomiting and abdominal pain.  Musculoskeletal: Negative for back pain.  Neurological: Positive for weakness. Negative for dizziness and headaches.  Psychiatric/Behavioral: Negative for suicidal ideas and substance abuse.    Blood pressure 134/97, pulse 110, temperature 98.6 F (37 C), temperature source Oral, resp. rate 18, height 5' 4"  (1.626 m), weight 78.835 kg (173 lb 12.8 oz), SpO2 97.00%. Physical Exam  Constitutional: She is oriented to person, place, and time. She appears well-nourished.  Cardiovascular: Normal rate and regular rhythm.   No murmur heard. Respiratory: Effort normal. She has wheezes.  GI: Soft. Bowel sounds are normal. There is no tenderness.  Musculoskeletal: Normal range of motion.  Neurological: She is alert and oriented to person, place, and time.  Skin: Skin is warm and dry.  Psychiatric: She has a normal mood and affect. Her behavior is normal. Judgment and thought content normal.     Assessment/Plan Mediastinal mass LAN SOB Hx NHL Scheduled for mediastinal mass biopsy Pt aware of procedure benefits and risks and agreeable to proceed Consent signed and in chart  [place] A 05/19/2014, 9:22 AM

## 2014-05-19 NOTE — Progress Notes (Signed)
Subjective: Patient reports continued anxiety throughout the night with adequate breathing as long as head of the bed remains elevated.  Denies pain and productive cough.  Patient is NPO for procedure today, but endorses good appetite.  Objective: Vital signs in last 24 hours: Filed Vitals:   05/18/14 2008 05/18/14 2201 05/18/14 2300 05/19/14 0615  BP: 145/110 111/72 124/88 134/97  Pulse: 86 45 100 110  Temp: 99.1 F (37.3 C) 98.9 F (37.2 C)  98.6 F (37 C)  TempSrc: Oral Oral  Oral  Resp: 22 20  18   Height:      Weight:    78.835 kg (173 lb 12.8 oz)  SpO2: 96% 95%  97%   Weight change:   Intake/Output Summary (Last 24 hours) at 05/19/14 0942 Last data filed at 05/19/14 0617  Gross per 24 hour  Intake    410 ml  Output    500 ml  Net    -90 ml   Physical Exam: General: Responsive and in NAD CV: RRR, no murmurs, rubs, or gallops Pulm: Decreased left lung sounds with crackles at the base.  RL CTA Abd:  NBS in 4Q.  Non-tender to deep and light palpation Ext: No edema or swelling.  Pulses 2+ bilaterally Skin: dry, no suspicious rashes or lesions Neuro: A and A x3. CN grossly intact   Lab Results: Basic Metabolic Panel:  Recent Labs Lab 05/18/14 0940 05/18/14 1620 05/19/14 0640  NA 145  --  144  K 3.9  --  4.2  CL 104  --  102  CO2 21  --  22  GLUCOSE 96  --  83  BUN 18  --  19  CREATININE 0.76  --  0.73  CALCIUM 10.1  --  9.7  MG  --  1.7  --    Liver Function Tests:  Recent Labs Lab 05/18/14 0940 05/19/14 0640  AST 48* 40*  ALT 47* 37*  ALKPHOS 82 75  BILITOT 0.4 0.4  PROT 7.7 6.9  ALBUMIN 4.1 3.8   No results found for this basename: LIPASE, AMYLASE,  in the last 168 hours No results found for this basename: AMMONIA,  in the last 168 hours CBC:  Recent Labs Lab 05/18/14 0940 05/19/14 0640  WBC 11.6* 10.3  NEUTROABS 7.2 6.3  HGB 15.4* 13.8  HCT 44.8 40.6  MCV 90.7 88.5  PLT 287 270   Cardiac Enzymes:  Recent Labs Lab  05/18/14 1615 05/18/14 2310 05/19/14 0640  TROPONINI 2.26* 2.02* 1.43*   BNP: No results found for this basename: PROBNP,  in the last 168 hours D-Dimer: No results found for this basename: DDIMER,  in the last 168 hours CBG: No results found for this basename: GLUCAP,  in the last 168 hours Hemoglobin A1C: No results found for this basename: HGBA1C,  in the last 168 hours Fasting Lipid Panel:  Recent Labs Lab 05/19/14 0640  CHOL 229*  HDL 43  LDLCALC 162*  TRIG 122  CHOLHDL 5.3   Thyroid Function Tests:  Recent Labs Lab 05/18/14 1616 05/18/14 1620  TSH 1.470  --   FREET4  --  1.36   Coagulation:  Recent Labs Lab 05/18/14 1620  LABPROT 14.2  INR 1.10   Anemia Panel: No results found for this basename: VITAMINB12, FOLATE, FERRITIN, TIBC, IRON, RETICCTPCT,  in the last 168 hours Urine Drug Screen: Drugs of Abuse  No results found for this basename: labopia, cocainscrnur, labbenz, amphetmu, thcu, labbarb    Alcohol Level:  No results found for this basename: ETH,  in the last 168 hours Urinalysis:  Recent Labs Lab 05/18/14 1150 05/19/14 0620  COLORURINE AMBER* YELLOW  LABSPEC 1.022 1.034*  PHURINE 6.0 5.0  GLUCOSEU NEGATIVE NEGATIVE  HGBUR NEGATIVE NEGATIVE  BILIRUBINUR SMALL* SMALL*  KETONESUR 15* NEGATIVE  PROTEINUR 30* NEGATIVE  UROBILINOGEN 0.2 0.2  NITRITE NEGATIVE NEGATIVE  LEUKOCYTESUR NEGATIVE NEGATIVE   Misc. Labs:  Micro Results: No results found for this or any previous visit (from the past 240 hour(s)). Studies/Results: Dg Chest 2 View  05/18/2014   CLINICAL DATA:  Cough and tachycardia.  Lymphoma.  EXAM: CHEST  2 VIEW  COMPARISON:  None.  FINDINGS: Trachea is midline. The cardiomediastinal silhouette is enlarged. There is airspace opacification in the left perihilar region and left lung base with elevation of the left hemidiaphragm. Pleural thickening is seen posteriorly on the lateral view. Right lung is grossly clear.  IMPRESSION: 1.  Left perihilar and left basilar airspace consolidation may be due to pneumonia. A centrally obstructing mass is another consideration. 2. Pleural thickening on the lateral view may be due to loculated pleural fluid. 3. Enlarged cardiomediastinal silhouette. Difficult to exclude adenopathy in this patient with a history lymphoma. 4. Patient is scheduled for CT chest later the same day.   Electronically Signed   By: Lorin Picket M.D.   On: 05/18/2014 11:18   Ct Angio Chest Pe W/cm &/or Wo Cm  05/18/2014   CLINICAL DATA:  Chest tightness.  History of lymphoma.  EXAM: CT ANGIOGRAPHY CHEST WITH CONTRAST  TECHNIQUE: Multidetector CT imaging of the chest was performed using the standard protocol during bolus administration of intravenous contrast. Multiplanar CT image reconstructions and MIPs were obtained to evaluate the vascular anatomy.  CONTRAST:  100 mL OMNIPAQUE IOHEXOL 350 MG/ML SOLN  COMPARISON:  PA and lateral chest earlier this same day.  FINDINGS: No pulmonary embolus is identified. The patient has a mass in the anterior mediastinum measuring at least 11.1 cm transverse by 6.5 cm AP by 8.7 cm craniocaudal. There are areas of necrosis within this mass lesion. Small axillary lymph nodes are identified. Index node on the left measures 1 cm in diameter on image 18 and index node on the right measures 0.7 cm on image 26. A precarinal node measuring 1.6 cm is seen on image 42. A node anterior to the carina eccentric to the right on image 39 measures 2.4 x 2.5 cm.  The patient has a small right pleural effusion and a small pericardial effusion. A somewhat larger left pleural effusion has a lobulated appearance and is circumferential about the left lung worrisome for loculation.  Scattered airspace disease in the left lung has an appearance most compatible with compressive atelectasis secondary to pleural effusion. Mild atelectasis on the right is identified.  Visualized intra-abdominal contents are unremarkable.  No focal bony abnormality is identified.  Review of the MIP images confirms the above findings.  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 Less likely differential considerations include invasive thymoma or germ cell tumor. The lesion does not appear to arise from the thyroid.  Loculated but small left pleural effusion.  Small right pleural effusion pericardial effusion appears simple.  Negative for pulmonary embolus.  Cardiomegaly.   Electronically Signed   By: Inge Rise M.D.   On: 05/18/2014 12:04   Medications: I have reviewed the patient's current medications. Scheduled Meds: . allopurinol  300 mg Oral Daily  .  enoxaparin (LOVENOX) injection  40 mg Subcutaneous Q24H  . lisinopril  5 mg Oral Daily  . metoprolol tartrate  25 mg Oral TID  . sodium chloride  3 mL Intravenous Q12H   Continuous Infusions: . sodium chloride 75 mL/hr at 05/19/14 0800   PRN Meds:.zolpidem Assessment/Plan: Principal Problem:   Mediastinal mass Active Problems:   Elevated troponin   Tachycardia   Elevated liver enzymes   Pleural effusion   Leukocytosis, unspecified   HTN (hypertension)   Long QT interval  53 y.o history of NHL presents with sob with exertion, palpitations found to have mediastinal mass on CT   #Mediastinal mass  -Anterior mediastinal mass is possibly lymphoma (likely with history of lymphoma in 1996) though other ddx thymus tumor, germ cell, or thyroid mass.  - bilateral pleural effusions and small pericardial effusion -Hematology/oncology consulted with recommendations. Outpatient patient wants to follow with Dr. Ammie Dalton  - f/u outside hospital records -Consented for IR CT guided biopsy 05/19/14 -f/u CT abd pelvis 05/19/14 -pending lymphocyte flow cytometry -Oxygen prn if short of breath   #Tumor Lysis Syndrome - Uric acid (10.4) and LDH elevated (750) - NS 75 ml/hr - allopurinol 300 mg po qd  #Elevated  troponin 2/2 demand ischemia vs. physical cardiac compression -Cardiology consulted and will follow  - f/u echo 05/19/14 - received aspirin 325 mg x1 on 9/2 - troponin now downtrending .89 =>2.26=>2.02=>1.43  #Tachycardia  -could be related to anterior mass causing sob leading to tachycardia  -TSH and Free T4 WNL -will monitor on telemetry   #Elevated liver enzymes  -could be related to underlying malignancy  -pending CT ab/pelvis, pending hepatitis labs ordered by H/O  -will trend CMET   #Leukocytosis  -2/2 lymphoma or recent steroid use -now WNL (05/19/14)  #HTN  -Cardiology started Lisinopril 5 mg qd, Lopressor 25 mg bid   #QT prolongation  -QTc max 584; noq 504 s/p IV mag 2g - CTM ekg  #insomnia  -Ambien prn  -ativan prn  #F/E/N  -NS 75 ml/hr -labs in am  - cardiac diet after CT/IR  #DVT px  -Lovenox, scds   This is a Careers information officer Note.  The care of the patient was discussed with Dr. Aundra Dubin and the assessment and plan formulated with their assistance.  Please see their attached note for official documentation of the daily encounter.   LOS: 1 day   Charise Killian, Med Student 05/19/2014, 9:42 AM

## 2014-05-19 NOTE — Progress Notes (Signed)
Cheryl Knox   DOB:07/02/1961   TK#:[id number]   DJM#:[id number]  I have seen the patient, examined her and edited the notes as follows  Subjective: Patient seen and examined .Afebrile.  No chest pain reported. Shortness of breath still main issue, unable to take a deep breath. Patient is very frustrated, kept fasting since last night awaiting Ct scan and biopsy, not done by 530 pm.  Scheduled Meds: . allopurinol  300 mg Oral Daily  . enoxaparin (LOVENOX) injection  40 mg Subcutaneous Q24H  . lisinopril  5 mg Oral Daily  . metoprolol tartrate  25 mg Oral TID  . sodium chloride  3 mL Intravenous Q12H   Continuous Infusions: . sodium chloride 75 mL/hr at 05/19/14 0800   PRN Meds:.zolpidem   Objective:  Filed Vitals:   05/19/14 0615  BP: 134/97  Pulse: 110  Temp: 98.6 F (37 C)  Resp: 18      Intake/Output Summary (Last 24 hours) at 05/19/14 0908 Last data filed at 05/19/14 0617  Gross per 24 hour  Intake    410 ml  Output    500 ml  Net    -90 ml    ECOG PERFORMANCE STATUS:2-3  GENERAL:alert,unconfortable due to shortness of breath, anxious  SKIN: skin color, texture, turgor are normal, no rashes or significant lesions  EYES: normal, conjunctiva are pink and non-injected, sclera clear  OROPHARYNX:no exudate, no erythema and lips, buccal mucosa, and tongue normal  NECK: supple, thyroid normal size, non-tender, without nodularity  LYMPH: no palpable lymphadenopathy in the cervical, axillary or inguinal area  LUNGS: clear to auscultation and percussion with normal breathing effort  HEART: tachycardia with no murmurs and no lower extremity edema  ABDOMEN:abdomen soft, non-tender and normal bowel sounds  Musculoskeletal:no cyanosis of digits and no clubbing  PSYCH: alert & oriented x 3 with fluent speech, anxious, tearful.  NEURO: no focal motor/sensory deficits  CBG (last 3)  No results found for this basename: GLUCAP,  in the last 72 hours   Labs:   Recent  Labs Lab 05/18/14 0940 05/19/14 0640  WBC 11.6* 10.3  HGB 15.4* 13.8  HCT 44.8 40.6  PLT 287 270  MCV 90.7 88.5  MCH 31.2 30.1  MCHC 34.4 34.0  RDW 14.0 14.2  LYMPHSABS 3.3 3.0  MONOABS 1.0 0.9  EOSABS 0.1 0.1  BASOSABS 0.1 0.0     Chemistries:    Recent Labs Lab 05/18/14 0940 05/18/14 1620 05/19/14 0640  NA 145  --  144  K 3.9  --  4.2  CL 104  --  102  CO2 21  --  22  GLUCOSE 96  --  83  BUN 18  --  19  CREATININE 0.76  --  0.73  CALCIUM 10.1  --  9.7  MG  --  1.7  --   AST 48*  --  40*  ALT 47*  --  37*  ALKPHOS 82  --  75  BILITOT 0.4  --  0.4    GFR Estimated Creatinine Clearance: 83.5 ml/min (by C-G formula based on Cr of 0.73).  Liver Function Tests:  Recent Labs Lab 05/18/14 0940 05/19/14 0640  AST 48* 40*  ALT 47* 37*  ALKPHOS 82 75  BILITOT 0.4 0.4  PROT 7.7 6.9  ALBUMIN 4.1 3.8    Urine Studies     Component Value Date/Time   COLORURINE YELLOW 05/19/2014 0620   APPEARANCEUR CLEAR 05/19/2014 0620   LABSPEC 1.034* 05/19/2014 2229  PHURINE 5.0 05/19/2014 0620   GLUCOSEU NEGATIVE 05/19/2014 0620   HGBUR NEGATIVE 05/19/2014 0620   BILIRUBINUR SMALL* 05/19/2014 0620   KETONESUR NEGATIVE 05/19/2014 0620   PROTEINUR NEGATIVE 05/19/2014 0620   UROBILINOGEN 0.2 05/19/2014 0620   NITRITE NEGATIVE 05/19/2014 0620   LEUKOCYTESUR NEGATIVE 05/19/2014 0620    Coagulation profile  Recent Labs Lab 05/18/14 1620  INR 1.10    Cardiac Enzymes:  Recent Labs Lab 05/18/14 0940 05/18/14 1615 05/18/14 2310 05/19/14 0640  TROPONINI 0.89* 2.26* 2.02* 1.43*  Lipid Profile  Recent Labs  05/19/14 0640  CHOL 229*  HDL 43  LDLCALC 162*  TRIG 122  CHOLHDL 5.3   Thyroid function studies  Recent Labs  05/18/14 1616  TSH 1.470   Microbiology No results found for this or any previous visit (from the past 240 hour(s)).     Imaging Studies:  Dg Chest 2 View  05/18/2014   CLINICAL DATA:  Cough and tachycardia.  Lymphoma.  EXAM: CHEST  2 VIEW   COMPARISON:  None.  FINDINGS: Trachea is midline. The cardiomediastinal silhouette is enlarged. There is airspace opacification in the left perihilar region and left lung base with elevation of the left hemidiaphragm. Pleural thickening is seen posteriorly on the lateral view. Right lung is grossly clear.  IMPRESSION: 1. Left perihilar and left basilar airspace consolidation may be due to pneumonia. A centrally obstructing mass is another consideration. 2. Pleural thickening on the lateral view may be due to loculated pleural fluid. 3. Enlarged cardiomediastinal silhouette. Difficult to exclude adenopathy in this patient with a history lymphoma. 4. Patient is scheduled for CT chest later the same day.   Electronically Signed   By: Lorin Picket M.D.   On: 05/18/2014 11:18   Ct Angio Chest Pe W/cm &/or Wo Cm  05/18/2014   CLINICAL DATA:  Chest tightness.  History of lymphoma.  EXAM: CT ANGIOGRAPHY CHEST WITH CONTRAST  TECHNIQUE: Multidetector CT imaging of the chest was performed using the standard protocol during bolus administration of intravenous contrast. Multiplanar CT image reconstructions and MIPs were obtained to evaluate the vascular anatomy.  CONTRAST:  100 mL OMNIPAQUE IOHEXOL 350 MG/ML SOLN  COMPARISON:  PA and lateral chest earlier this same day.  FINDINGS: No pulmonary embolus is identified. The patient has a mass in the anterior mediastinum measuring at least 11.1 cm transverse by 6.5 cm AP by 8.7 cm craniocaudal. There are areas of necrosis within this mass lesion. Small axillary lymph nodes are identified. Index node on the left measures 1 cm in diameter on image 18 and index node on the right measures 0.7 cm on image 26. A precarinal node measuring 1.6 cm is seen on image 42. A node anterior to the carina eccentric to the right on image 39 measures 2.4 x 2.5 cm.  The patient has a small right pleural effusion and a small pericardial effusion. A somewhat larger left pleural effusion has a  lobulated appearance and is circumferential about the left lung worrisome for loculation.  Scattered airspace disease in the left lung has an appearance most compatible with compressive atelectasis secondary to pleural effusion. Mild atelectasis on the right is identified.  Visualized intra-abdominal contents are unremarkable. No focal bony abnormality is identified.  Review of the MIP images confirms the above findings.  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 Less likely differential considerations include invasive thymoma or germ cell tumor. The lesion does not appear to arise  from the thyroid.  Loculated but small left pleural effusion.  Small right pleural effusion pericardial effusion appears simple.  Negative for pulmonary embolus.  Cardiomegaly.   Electronically Signed   By: Inge Rise M.D.   On: 05/18/2014 12:04    Assessment/Plan: 53 y.o. 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 She was in remission at least dating back to 2006 per patient report.  She presented with increasing shortness of breath, found to have pleural effusion, as well as a very large mediastinal mass with adenopathy, suspicious for NHL recurrence.  CT of the abdomen and pelvis are currently pending. She will probably need cardiothoracic surgery consultation if CT of the abdomen & pelvis do not reveal other suspicious sites for safe biopsy. Patient is to undergo biopsy of the mediastinum mass versus a lymph node that may be seen more peripherally for definite diagnosis, scheduled for today.  Dr. Benay Spice has been notified of the patient's admission, and will see Ms. Amesquita upon his return as he is out of town at this time.  Admitting team is to request pertinent records from [facility] for recent history, as  well as from [facility] for review of her treatments in studies performed at that facility  Will continue to follow with you.   DVT prophylaxis  On Lovenox   Leukocytosis  Reactive due to likely NHL recurrence. The patient is afebrile.  She also received a course of prednisone as she was being treated for unresolved bronchitis  WBC in now normalized to 10.3 At this time, continue to monitor, no intervention is indicated   Palpitations with tachycardia  Elevated troponin  Cardiology consulted. According to the notes, troponin elevation is consistent with myocardial irritation from small pericardial effusion/ demand ischemia in the setting of ongoing tachycardia and large mediastinal mass. Acute coronary syndrome is not suspected Appreciate Cardiology follow up 2 D echo showed preserved EF without significant pericardial effusion or tamponade physiology. Gentle IVF hydration initiated   Tumor lysis prophylaxis  Uric acid on 9/2 was 10.4  and LDH level elevated at 750 on 9/2 .  Will start her on allopurinol today along with IV fluids   Insomnia and anxiety  Controlled with Ambien   Full Code  Discharge planning The patient is very upset as CT scan was not done by 530 p.m. After much discussion, she requested to be transferred to [facility] who is familiar with her history and may expedite her workup and start of chemotherapy. I spoke with the primary team and recommend transferred to [facility] to the [place] service.   **Disclaimer: This note was dictated with voice recognition software. Similar sounding words can inadvertently be transcribed and this note may contain transcription errors which may not have been corrected upon publication of note.Sharene Butters E, PA-C 05/19/2014  9:08 AM Dayani Winbush, MD 05/19/2014

## 2014-05-19 NOTE — Progress Notes (Signed)
  Echocardiogram 2D Echocardiogram has been performed.  Cheryl Knox 05/19/2014, 10:41 AM

## 2014-05-20 ENCOUNTER — Encounter: Payer: Self-pay | Admitting: Internal Medicine

## 2014-05-20 DIAGNOSIS — C8350 Lymphoblastic (diffuse) lymphoma, unspecified site: Secondary | ICD-10-CM

## 2014-05-20 HISTORY — DX: Lymphoblastic (diffuse) lymphoma, unspecified site: C83.50

## 2014-06-10 DIAGNOSIS — F32A Depression, unspecified: Secondary | ICD-10-CM | POA: Insufficient documentation

## 2014-06-10 DIAGNOSIS — F329 Major depressive disorder, single episode, unspecified: Secondary | ICD-10-CM | POA: Insufficient documentation

## 2014-06-15 ENCOUNTER — Telehealth: Payer: Self-pay | Admitting: Internal Medicine

## 2014-06-15 DIAGNOSIS — J9859 Other diseases of mediastinum, not elsewhere classified: Secondary | ICD-10-CM

## 2014-06-15 DIAGNOSIS — C8350 Lymphoblastic (diffuse) lymphoma, unspecified site: Secondary | ICD-10-CM

## 2014-06-15 NOTE — Telephone Encounter (Signed)
Pt states she spoke with Asa Lente and she assured her that it would be taken care of.  Please send copy of referral to fax#[phone].  Needs by OCT 6th

## 2014-06-15 NOTE — Telephone Encounter (Signed)
Is requesting referral for office visits for chemo therapy.

## 2014-06-15 NOTE — Telephone Encounter (Signed)
There is no referral in system for this pt. Pt does have [place] that requires referral to doctor put it will have to come from who is listed on her card.

## 2014-06-16 NOTE — Telephone Encounter (Signed)
I will make referral to [facility] onc - pt reports she has verified with er insurance that I am listed as her PCP despite no OV with me yet - If "notes" needed, send copy of hosp DC summary 05/19/14 Thanks!

## 2014-06-30 DIAGNOSIS — D61818 Other pancytopenia: Secondary | ICD-10-CM | POA: Insufficient documentation

## 2014-07-13 DIAGNOSIS — D6481 Anemia due to antineoplastic chemotherapy: Secondary | ICD-10-CM | POA: Insufficient documentation

## 2014-07-13 DIAGNOSIS — T451X5A Adverse effect of antineoplastic and immunosuppressive drugs, initial encounter: Secondary | ICD-10-CM

## 2014-07-13 HISTORY — DX: Anemia due to antineoplastic chemotherapy: D64.81

## 2014-11-28 DIAGNOSIS — Z949 Transplanted organ and tissue status, unspecified: Secondary | ICD-10-CM | POA: Insufficient documentation

## 2015-01-09 DIAGNOSIS — D89813 Graft-versus-host disease, unspecified: Secondary | ICD-10-CM | POA: Insufficient documentation

## 2015-01-09 DIAGNOSIS — G4701 Insomnia due to medical condition: Secondary | ICD-10-CM | POA: Insufficient documentation

## 2015-01-09 DIAGNOSIS — L988 Other specified disorders of the skin and subcutaneous tissue: Secondary | ICD-10-CM | POA: Insufficient documentation

## 2015-07-19 ENCOUNTER — Telehealth: Payer: Self-pay | Admitting: Internal Medicine

## 2015-07-19 DIAGNOSIS — IMO0002 Reserved for concepts with insufficient information to code with codable children: Secondary | ICD-10-CM

## 2015-07-19 NOTE — Telephone Encounter (Signed)
Cheryl Knox BMT coordinator from [facility]plant at [facility] in [facility] called requesting a referral from her primary care to a foot doctor and I let her know that she has never seen Dr. Asa Lente but they have been sending their notes here regarding her BMT all this time.  Can you please call her at [phone]

## 2015-07-24 NOTE — Telephone Encounter (Signed)
Ok to place podiatry referral on my behalf -

## 2015-10-10 DIAGNOSIS — S92026A Nondisplaced fracture of anterior process of unspecified calcaneus, initial encounter for closed fracture: Secondary | ICD-10-CM | POA: Insufficient documentation

## 2017-03-21 DIAGNOSIS — H0012 Chalazion right lower eyelid: Secondary | ICD-10-CM | POA: Diagnosis not present

## 2017-07-12 ENCOUNTER — Encounter (HOSPITAL_COMMUNITY): Payer: Self-pay | Admitting: *Deleted

## 2017-07-12 ENCOUNTER — Emergency Department (HOSPITAL_COMMUNITY)
Admission: EM | Admit: 2017-07-12 | Discharge: 2017-07-12 | Disposition: A | Discharge status: Home / Self Care | Payer: 59 | Attending: Emergency Medicine | Admitting: Emergency Medicine

## 2017-07-12 DIAGNOSIS — M545 Low back pain: Secondary | ICD-10-CM | POA: Diagnosis present

## 2017-07-12 DIAGNOSIS — R000 Tachycardia, unspecified: Secondary | ICD-10-CM | POA: Diagnosis not present

## 2017-07-12 DIAGNOSIS — R21 Rash and other nonspecific skin eruption: Secondary | ICD-10-CM | POA: Diagnosis not present

## 2017-07-12 DIAGNOSIS — Z87891 Personal history of nicotine dependence: Secondary | ICD-10-CM | POA: Diagnosis not present

## 2017-07-12 DIAGNOSIS — Z8572 Personal history of non-Hodgkin lymphomas: Secondary | ICD-10-CM | POA: Insufficient documentation

## 2017-07-12 DIAGNOSIS — Z79899 Other long term (current) drug therapy: Secondary | ICD-10-CM | POA: Insufficient documentation

## 2017-07-12 DIAGNOSIS — B029 Zoster without complications: Secondary | ICD-10-CM | POA: Diagnosis not present

## 2017-07-12 DIAGNOSIS — I10 Essential (primary) hypertension: Secondary | ICD-10-CM | POA: Insufficient documentation

## 2017-07-12 LAB — CBC WITH DIFFERENTIAL/PLATELET
BASOS PCT: 1 %
Basophils Absolute: 0 10*3/uL (ref 0.0–0.1)
Eosinophils Absolute: 0.1 10*3/uL (ref 0.0–0.7)
Eosinophils Relative: 1 %
HCT: 32.4 % — ABNORMAL LOW (ref 36.0–46.0)
HEMOGLOBIN: 11.1 g/dL — AB (ref 12.0–15.0)
Lymphocytes Relative: 31 %
Lymphs Abs: 1.7 10*3/uL (ref 0.7–4.0)
MCH: 33.2 pg (ref 26.0–34.0)
MCHC: 34.3 g/dL (ref 30.0–36.0)
MCV: 97 fL (ref 78.0–100.0)
MONOS PCT: 12 %
Monocytes Absolute: 0.7 10*3/uL (ref 0.1–1.0)
NEUTROS PCT: 55 %
Neutro Abs: 3 10*3/uL (ref 1.7–7.7)
Platelets: 202 10*3/uL (ref 150–400)
RBC: 3.34 MIL/uL — AB (ref 3.87–5.11)
RDW: 12.5 % (ref 11.5–15.5)
WBC: 5.5 10*3/uL (ref 4.0–10.5)

## 2017-07-12 LAB — BASIC METABOLIC PANEL
Anion gap: 10 (ref 5–15)
BUN: 24 mg/dL — ABNORMAL HIGH (ref 6–20)
CHLORIDE: 103 mmol/L (ref 101–111)
CO2: 25 mmol/L (ref 22–32)
CREATININE: 1.1 mg/dL — AB (ref 0.44–1.00)
Calcium: 9.3 mg/dL (ref 8.9–10.3)
GFR calc non Af Amer: 55 mL/min — ABNORMAL LOW (ref >60)
Glucose, Bld: 91 mg/dL (ref 65–99)
Potassium: 3.7 mmol/L (ref 3.5–5.1)
Sodium: 138 mmol/L (ref 135–145)

## 2017-07-12 MED ORDER — HYDROMORPHONE HCL 1 MG/ML IJ SOLN
4.0000 mg | Freq: Once | INTRAMUSCULAR | Status: DC
  Filled 2017-07-12: qty 4

## 2017-07-12 MED ORDER — ACYCLOVIR 400 MG PO TABS: 400.0000 mg | ORAL_TABLET | Freq: Three times a day (TID) | ORAL | 0 refills | Status: DC

## 2017-07-12 MED ORDER — HYDROMORPHONE HCL 1 MG/ML IJ SOLN
4.0000 mg | Freq: Once | INTRAMUSCULAR | Status: AC
  Administered 2017-07-12: 4 mg via INTRAMUSCULAR

## 2017-07-12 MED ORDER — ACYCLOVIR 400 MG PO TABS: 800.0000 mg | ORAL_TABLET | Freq: Every day | ORAL | 50 refills | Status: DC

## 2017-07-12 MED ORDER — HYDROMORPHONE HCL 2 MG/ML IJ SOLN: 4.0000 mg | Freq: Once | INTRAMUSCULAR | Status: DC

## 2017-07-12 MED ORDER — ACYCLOVIR 400 MG PO TABS: 800.0000 mg | ORAL_TABLET | Freq: Every day | ORAL | 0 refills | Status: DC

## 2017-07-12 MED ORDER — VALACYCLOVIR HCL 500 MG PO TABS
1000.0000 mg | ORAL_TABLET | ORAL | Status: AC
  Administered 2017-07-12: 1000 mg via ORAL
  Filled 2017-07-12: qty 2

## 2017-07-12 MED ORDER — ACYCLOVIR 400 MG PO TABS: 400.0000 mg | ORAL_TABLET | Freq: Three times a day (TID) | ORAL | 0 refills | Status: AC

## 2017-07-12 NOTE — Discharge Instructions (Signed)
Please take acyclovir 400 mg three times per day for 5 days.  Call your doctor on Monday for follow up.

## 2017-07-12 NOTE — ED Provider Notes (Signed)
[name] EMERGENCY DEPARTMENT Provider Note   CSN: [id number] Arrival date & time: 07/12/17  1349     History   Chief Complaint Chief Complaint  Patient presents with  . Herpes Zoster    HPI Cheryl Knox is a 56 y.o. female.  HPI   56 year old female presents today complaining of left low back pain radiating to the medial leg several days ago after lifting.  She describes a severe pain and thought she had sciatica.  She broke out with a rash today.  She states that she has had a similar rash in the past and it was herpes zoster.  She had a prescription from previous episode and failed her acyclovir and took a dose at home today.  She presents complaining that she continues to have severe pain.  She thinks it is muscular pain.  She states that she took Dilaudid 10 mg at home.  She denies fever, chills, chest pain, dyspnea.  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  She is followed at [facility] by her oncologist.  Past Medical History:  Diagnosis Date  . Cardiac tamponade    noted 1996 with last NHL diagnosis  . Herpes zoster    T8/9 dermatome   . Hypertension   . Mass of mediastinum 05/2014  . 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 Pericardial effusion    a. at time of NHL diagnosis, s/p tube placement per patient.  . Shortness of breath     Patient Active Problem List   Diagnosis Date Noted  . Insomnia due to medical condition 01/09/2015  . 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 Hypomagnesemia 12/05/2014  . History of organ or tissue transplant 11/28/2014  . Acquired pancytopenia ([place]) 06/30/2014  . Clinical depression 06/10/2014  . 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 Anxiety state,  unspecified 05/19/2014  . Mediastinal mass 05/18/2014  . Pleural effusion 05/18/2014  . HTN (hypertension) 05/18/2014    Past Surgical History:  Procedure Laterality Date  . Port a Cath Placement      OB History    No data available       Home Medications    Prior to Admission medications   Medication Sig Start Date End Date Taking? Authorizing Provider  amLODipine (NORVASC) 5 MG tablet Take 5 mg by mouth daily. 05/26/15 05/25/16  [provider]  clonazePAM (KLONOPIN) 0.5 MG tablet Take 0.5 mg by mouth 2 (two) times daily as needed. 07/14/15   [provider]  dapsone 100 MG tablet Take 100 mg by mouth daily. Mon-Fri only 03/28/15   [provider]  dexamethasone (DECADRON) 0.5 MG/5ML solution Take 1 mg by mouth 3 (three) times daily. 05/08/15   [provider]  hydrochlorothiazide (MICROZIDE) 12.5 MG capsule Take 12.5 mg by mouth daily. 07/07/15   [provider]  lisinopril (PRINIVIL,ZESTRIL) 5 MG tablet Take 1 tablet (5 mg total) by mouth daily. 05/19/14   McLean-Scocuzza, Nino Glow, MD  LORazepam (ATIVAN) 1 MG tablet Take 1 tablet (1 mg total) by mouth 2 (two) times daily as needed for anxiety. 05/19/14   McLean-Scocuzza, Nino Glow, MD  losartan (COZAAR) 25 MG tablet Take 50 mg by mouth daily. 04/10/15 04/09/16  [provider]  metoprolol tartrate (LOPRESSOR) 25 MG tablet Take 1 tablet (25 mg total) by mouth 3 (three) times daily. 05/19/14   McLean-Scocuzza, Nino Glow, MD  pantoprazole (PROTONIX) 40 MG tablet Take 40 mg by mouth daily. 05/08/15   [provider]  sertraline (ZOLOFT) 50 MG tablet Take 50 mg by mouth daily. 05/08/15   [provider]  tacrolimus (PROGRAF) 0.5 MG capsule Take 1 tablet (0.5 mg) each evening along with two 1-mg tablets for total evening dose of 2.5 mg 03/28/15   [provider]  tacrolimus (PROGRAF) 1 MG capsule Take 5 tablets (5 mg) by mouth each morning and 2 tablets (along with one 0.5-mg  tablet) each evening for total evening dose of 2.5 mg. 03/28/15   [provider]  traMADol (ULTRAM) 50 MG tablet Take 50 mg by mouth every 6 (six) hours as needed. 07/17/15   [provider]  traZODone (DESYREL) 50 MG tablet Take 50 mg by mouth at bedtime as needed. 04/04/15   [provider]  valACYclovir (VALTREX) 500 MG tablet Take 500 mg by mouth 2 (two) times daily. 07/12/15   [provider]  zolpidem (AMBIEN) 5 MG tablet Take 1 tablet (5 mg total) by mouth at bedtime as needed for sleep. 05/19/14   McLean-Scocuzza, Nino Glow, MD    Family History Family History  Problem Relation Age of Onset  . Valvular heart disease Mother   . Cancer Mother        breast still living age 51 y.o   . Depression Father        deceased ? cause     Social History Social History  Substance Use Topics  . Smoking status: Former Smoker    Types: Cigarettes    Quit date: 09/16/2004  . Smokeless tobacco: Never Used  . Alcohol use No     Allergies   Ondansetron and Morphine   Review of Systems Review of Systems  All other systems reviewed and are negative.    Physical Exam Updated Vital Signs BP (!) 180/118   Pulse (!) 120   Temp 98 F (36.7 C) (Oral)   Resp 14   Ht 1.613 m (5' 3.5")   Wt 92.1 kg (203 lb)   SpO2 99%   BMI 35.40 kg/m   Physical Exam  Constitutional: She is oriented to person, place, and time. She appears well-developed and well-nourished. No distress.  HENT:  Head: Normocephalic and atraumatic.  Eyes: Pupils are equal, round, and reactive to light.  Neck: Normal range of motion. Neck supple.  Cardiovascular: Tachycardia present.   Pulmonary/Chest: Effort normal and breath sounds normal.  Abdominal: Soft. Bowel sounds are normal.  Musculoskeletal: Normal range of motion.  Neurological: She is alert and oriented to person, place, and time.  Skin: Skin is warm. Capillary refill takes less than 2 seconds. Rash noted.     Vesicular  rash consistent with L2 dermatome  Nursing note and vitals reviewed.    ED Treatments / Results  Labs (all labs ordered are listed, but only abnormal results are displayed) Labs Reviewed  CBC WITH DIFFERENTIAL/PLATELET  BASIC METABOLIC PANEL    EKG  EKG Interpretation None       Radiology No results found.  Procedures Procedures (including critical care time)  Medications Ordered in ED Medications  HYDROmorphone (DILAUDID) injection 4 mg (not administered)     Initial Impression / Assessment and Plan / ED Course  I have reviewed the triage vital signs and the nursing  notes.  Pertinent labs & imaging results that were available during my care of the patient were reviewed by me and considered in my medical decision making (see chart for details).     1- herpes zoster- patient with h.o. bmt but currently with normal wbc,   Plan continue antiviral therapy  She reports taking acyclovir this am 2- tachycardia- patient with hr up to 120 here.  Review of previous records reveals prior tachycardia present during last hospitalization of unknown origin.    Final Clinical Impressions(s) / ED Diagnoses   Final diagnoses:  Herpes zoster without complication  Tachycardia    New Prescriptions New Prescriptions   No medications on file     Pattricia Boss, MD 07/12/17 2248

## 2017-07-28 DIAGNOSIS — R000 Tachycardia, unspecified: Secondary | ICD-10-CM | POA: Diagnosis not present

## 2017-07-28 DIAGNOSIS — L270 Generalized skin eruption due to drugs and medicaments taken internally: Secondary | ICD-10-CM | POA: Diagnosis not present

## 2017-07-28 DIAGNOSIS — Z9481 Bone marrow transplant status: Secondary | ICD-10-CM | POA: Diagnosis not present

## 2017-07-28 DIAGNOSIS — J320 Chronic maxillary sinusitis: Secondary | ICD-10-CM | POA: Diagnosis not present

## 2017-07-28 DIAGNOSIS — L986 Other infiltrative disorders of the skin and subcutaneous tissue: Secondary | ICD-10-CM | POA: Diagnosis not present

## 2017-07-28 DIAGNOSIS — B029 Zoster without complications: Secondary | ICD-10-CM | POA: Insufficient documentation

## 2017-07-28 DIAGNOSIS — Z01810 Encounter for preprocedural cardiovascular examination: Secondary | ICD-10-CM | POA: Diagnosis not present

## 2017-07-28 DIAGNOSIS — G039 Meningitis, unspecified: Secondary | ICD-10-CM | POA: Diagnosis not present

## 2017-07-28 DIAGNOSIS — C8590 Non-Hodgkin lymphoma, unspecified, unspecified site: Secondary | ICD-10-CM | POA: Diagnosis not present

## 2017-07-28 DIAGNOSIS — Z8619 Personal history of other infectious and parasitic diseases: Secondary | ICD-10-CM | POA: Insufficient documentation

## 2017-07-28 DIAGNOSIS — R0902 Hypoxemia: Secondary | ICD-10-CM | POA: Diagnosis not present

## 2017-07-28 DIAGNOSIS — C9100 Acute lymphoblastic leukemia not having achieved remission: Secondary | ICD-10-CM | POA: Diagnosis not present

## 2017-07-28 DIAGNOSIS — R569 Unspecified convulsions: Secondary | ICD-10-CM | POA: Diagnosis not present

## 2017-07-28 DIAGNOSIS — R9082 White matter disease, unspecified: Secondary | ICD-10-CM | POA: Diagnosis not present

## 2017-07-28 DIAGNOSIS — I252 Old myocardial infarction: Secondary | ICD-10-CM | POA: Diagnosis not present

## 2017-07-28 DIAGNOSIS — R918 Other nonspecific abnormal finding of lung field: Secondary | ICD-10-CM | POA: Diagnosis not present

## 2017-07-28 DIAGNOSIS — R509 Fever, unspecified: Secondary | ICD-10-CM | POA: Diagnosis not present

## 2017-07-28 DIAGNOSIS — C9150 Adult T-cell lymphoma/leukemia (HTLV-1-associated) not having achieved remission: Secondary | ICD-10-CM | POA: Diagnosis not present

## 2017-07-28 DIAGNOSIS — R0989 Other specified symptoms and signs involving the circulatory and respiratory systems: Secondary | ICD-10-CM | POA: Diagnosis not present

## 2017-07-28 DIAGNOSIS — I10 Essential (primary) hypertension: Secondary | ICD-10-CM | POA: Diagnosis not present

## 2017-07-28 DIAGNOSIS — C8350 Lymphoblastic (diffuse) lymphoma, unspecified site: Secondary | ICD-10-CM | POA: Diagnosis not present

## 2017-07-28 DIAGNOSIS — G9340 Encephalopathy, unspecified: Secondary | ICD-10-CM | POA: Diagnosis not present

## 2017-07-28 DIAGNOSIS — R404 Transient alteration of awareness: Secondary | ICD-10-CM | POA: Diagnosis not present

## 2017-07-28 DIAGNOSIS — E8770 Fluid overload, unspecified: Secondary | ICD-10-CM | POA: Diagnosis not present

## 2017-07-28 DIAGNOSIS — N179 Acute kidney failure, unspecified: Secondary | ICD-10-CM | POA: Diagnosis not present

## 2017-07-28 DIAGNOSIS — Z9484 Stem cells transplant status: Secondary | ICD-10-CM | POA: Diagnosis not present

## 2017-07-28 DIAGNOSIS — R21 Rash and other nonspecific skin eruption: Secondary | ICD-10-CM | POA: Diagnosis not present

## 2017-07-28 DIAGNOSIS — I871 Compression of vein: Secondary | ICD-10-CM | POA: Diagnosis not present

## 2017-07-28 DIAGNOSIS — R9431 Abnormal electrocardiogram [ECG] [EKG]: Secondary | ICD-10-CM | POA: Diagnosis not present

## 2017-07-29 DIAGNOSIS — D539 Nutritional anemia, unspecified: Secondary | ICD-10-CM | POA: Insufficient documentation

## 2017-08-08 DIAGNOSIS — D72829 Elevated white blood cell count, unspecified: Secondary | ICD-10-CM | POA: Diagnosis not present

## 2017-08-08 DIAGNOSIS — Z9484 Stem cells transplant status: Secondary | ICD-10-CM | POA: Diagnosis not present

## 2017-08-08 DIAGNOSIS — B020 Zoster encephalitis: Secondary | ICD-10-CM | POA: Diagnosis not present

## 2017-08-12 DIAGNOSIS — S92024G Nondisplaced fracture of anterior process of right calcaneus, subsequent encounter for fracture with delayed healing: Secondary | ICD-10-CM | POA: Diagnosis not present

## 2017-08-12 DIAGNOSIS — I159 Secondary hypertension, unspecified: Secondary | ICD-10-CM | POA: Diagnosis not present

## 2017-08-12 DIAGNOSIS — B0229 Other postherpetic nervous system involvement: Secondary | ICD-10-CM | POA: Diagnosis not present

## 2017-08-12 DIAGNOSIS — I10 Essential (primary) hypertension: Secondary | ICD-10-CM | POA: Diagnosis not present

## 2017-08-12 DIAGNOSIS — C9150 Adult T-cell lymphoma/leukemia (HTLV-1-associated) not having achieved remission: Secondary | ICD-10-CM | POA: Diagnosis not present

## 2017-09-26 DIAGNOSIS — I10 Essential (primary) hypertension: Secondary | ICD-10-CM | POA: Diagnosis not present

## 2017-09-26 DIAGNOSIS — Z9481 Bone marrow transplant status: Secondary | ICD-10-CM | POA: Diagnosis not present

## 2017-09-26 DIAGNOSIS — I159 Secondary hypertension, unspecified: Secondary | ICD-10-CM | POA: Diagnosis not present

## 2017-09-26 DIAGNOSIS — C8350 Lymphoblastic (diffuse) lymphoma, unspecified site: Secondary | ICD-10-CM | POA: Diagnosis not present

## 2017-09-26 DIAGNOSIS — B0229 Other postherpetic nervous system involvement: Secondary | ICD-10-CM | POA: Diagnosis not present

## 2017-09-26 DIAGNOSIS — D89813 Graft-versus-host disease, unspecified: Secondary | ICD-10-CM | POA: Diagnosis not present

## 2018-03-18 ENCOUNTER — Encounter: Payer: Self-pay | Admitting: Podiatry

## 2018-03-18 ENCOUNTER — Ambulatory Visit (INDEPENDENT_AMBULATORY_CARE_PROVIDER_SITE_OTHER): Payer: 59 | Admitting: Podiatry

## 2018-03-18 VITALS — BP 130/86 | HR 109

## 2018-03-18 DIAGNOSIS — L989 Disorder of the skin and subcutaneous tissue, unspecified: Secondary | ICD-10-CM

## 2018-03-18 DIAGNOSIS — B070 Plantar wart: Secondary | ICD-10-CM | POA: Diagnosis not present

## 2018-03-18 NOTE — Progress Notes (Signed)
This patient presents the office with chief complaint of a painful callus on the outside  of her right foot.  His patient states that she has been living with this painful callus for months, but is in able to determine exactly when the callus started.  She has provided no self treatment nor sought any professional help.  She has attempted to pass the callus down herself but the problem continues.  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  She presents the office today for an evaluation and treatment of this painful callus, right foot.  General Appearance  Alert, conversant and in no acute stress.  Vascular  Dorsalis pedis and posterior tibial  pulses are palpable  bilaterally.  Capillary return is within normal limits  bilaterally. Temperature is within normal limits  bilaterally.  Neurologic  Senn-Weinstein monofilament wire test within normal limits  bilaterally. Muscle power within normal limits bilaterally.  Nails normal nails noted with no evidence of bacterial or fungal infection.  Orthopedic  No limitations of motion of motion feet .  No crepitus or effusions noted.  No bony pathology or digital deformities noted.  Skin  normotropic skin with no porokeratosis noted bilaterally.  No signs of infections or ulcers noted.  Benign skin lesion sub 5th metatarsal right foot.  Debridement reveals mosaic wart right foot.  Bleeding noted upon debridement.  Benign skin lesion right foot.  Plantar wart right foot.   IE  Apply acid to wart.  RTC 7-10 days.     Gardiner Barefoot DPM

## 2018-03-26 ENCOUNTER — Telehealth: Payer: Self-pay | Admitting: Podiatry

## 2018-03-26 NOTE — Telephone Encounter (Signed)
[facility] called and stated that they had an order from our office for Cheryl Knox in [place] but the pt lives in [place]. Please give the pt a call

## 2018-03-26 NOTE — Telephone Encounter (Signed)
Left message requesting a call back with clarification of referral to [facility].

## 2018-03-31 ENCOUNTER — Ambulatory Visit (INDEPENDENT_AMBULATORY_CARE_PROVIDER_SITE_OTHER): Payer: 59 | Admitting: Podiatry

## 2018-03-31 ENCOUNTER — Encounter: Payer: Self-pay | Admitting: Podiatry

## 2018-03-31 DIAGNOSIS — L989 Disorder of the skin and subcutaneous tissue, unspecified: Secondary | ICD-10-CM

## 2018-03-31 DIAGNOSIS — B070 Plantar wart: Secondary | ICD-10-CM

## 2018-03-31 NOTE — Progress Notes (Signed)
This patient presents to the office for continued evaluation and treatment of a diagnosis of a wart under the outside of her right foot.  She was seen approximately 2 weeks ago and was treated with acid.  She says she has not had a significant reaction due to the acid.  She presents the office today for continued evaluation and treatment of her warts, right foot.  General Appearance  Alert, conversant and in no acute stress.  Vascular  Dorsalis pedis and posterior tibial  pulses are palpable  bilaterally.  Capillary return is within normal limits  bilaterally. Temperature is within normal limits  bilaterally.  Neurologic  Senn-Weinstein monofilament wire test within normal limits  bilaterally. Muscle power within normal limits bilaterally.  Nails normal nails noted with no evidence of bacterial or fungal infection.  Orthopedic  No limitations of motion of motion feet .  No crepitus or effusions noted.  No bony pathology or digital deformities noted.  Skin  normotropic skin with no porokeratosis noted bilaterally except for wart tissue sub 5th met right foot.  Patient has mosaic wart sub 5th met with a small localized wart distal to bigger mosaic wart.  No signs of infections or ulcers noted.  There are callus noted with black hemmorhagic lesions in center of wart.  Benign skin lesion  Plantar wart right foot.  ROV.  Debridement of warts do reveal improvement in reduction in the thickness of the warts.  There is still cauliflower appearing  tissue at the base of both warts. The base of the warts continued  to bleed.  Reapplication of acid to wart sites.  RTC 10 days.   Gardiner Barefoot DPM

## 2018-04-13 ENCOUNTER — Ambulatory Visit (INDEPENDENT_AMBULATORY_CARE_PROVIDER_SITE_OTHER): Payer: 59 | Admitting: Podiatry

## 2018-04-13 ENCOUNTER — Encounter: Payer: Self-pay | Admitting: Podiatry

## 2018-04-13 DIAGNOSIS — M779 Enthesopathy, unspecified: Secondary | ICD-10-CM

## 2018-04-13 DIAGNOSIS — M7752 Other enthesopathy of left foot: Secondary | ICD-10-CM | POA: Diagnosis not present

## 2018-04-13 DIAGNOSIS — B070 Plantar wart: Secondary | ICD-10-CM

## 2018-04-13 MED ORDER — TRIAMCINOLONE ACETONIDE 10 MG/ML IJ SUSP
10.0000 mg | Freq: Once | INTRAMUSCULAR | Status: AC
Start: 2018-04-13 — End: 2018-04-13
  Administered 2018-04-13: 10 mg

## 2018-04-13 NOTE — Patient Instructions (Signed)
Warts Warts are small growths on the skin. They are common, and they are caused by a type of germ (virus). Warts can occur on many areas of the body. A person may have one wart or more than one wart. Warts can spread if you scratch a wart and then scratch normal skin. Most warts will go away over many months to a couple years. Treatments may be done if needed. Follow these instructions at home:  Apply over-the-counter and prescription medicines only as told by your doctor.  Do not apply over-the-counter wart medicines to your face or genitals before you ask your doctor if it is okay to do that.  Do not scratch or pick at a wart.  Wash your hands after you touch a wart.  Avoid shaving hair that is over a wart.  Keep all follow-up visits as told by your doctor. This is important. Contact a doctor if:  Your warts do not improve after treatment.  You have redness, swelling, or pain at the site of a wart.  You have bleeding from a wart, and the bleeding does not stop when you put light pressure on the wart.  You have diabetes and you get a wart. This information is not intended to replace advice given to you by your health care provider. Make sure you discuss any questions you have with your health care provider. Document Released: 01/03/2011 Document Revised: 02/08/2016 Document Reviewed: 11/28/2014 Elsevier Interactive Patient Education  2018 Elsevier Inc.  

## 2018-04-14 ENCOUNTER — Ambulatory Visit: Payer: 59 | Admitting: Podiatry

## 2018-04-14 NOTE — Progress Notes (Signed)
Subjective:   Patient ID: Cheryl Knox, female   DOB: 57 y.o.   MRN: [id number]   HPI Patient presents with a lot of of discomfort with lesions on the outside of the right fifth metatarsal that been treated previously and discomfort in the left ankle which really makes it hard for her to be ambulatory   ROS      Objective:  Physical Exam  Neurovascular status intact with multiple keratotic lesions lateral side right fifth metatarsal that upon debridement shows pinpoint bleeding with discomfort of the left sinus tarsi     Assessment:  Verruca plantaris right lateral foot and sinus tarsitis left     Plan:  Debridement of lesion and I applied chemical agent to create immune response with sterile dressing and instructed what to do blistering were to occur right and for the left I did inject the sinus tarsi 3 mg Kenalog 5 mg Xylocaine

## 2018-05-04 ENCOUNTER — Ambulatory Visit (INDEPENDENT_AMBULATORY_CARE_PROVIDER_SITE_OTHER): Payer: 59 | Admitting: Podiatry

## 2018-05-04 ENCOUNTER — Encounter: Payer: Self-pay | Admitting: Podiatry

## 2018-05-04 DIAGNOSIS — M779 Enthesopathy, unspecified: Secondary | ICD-10-CM

## 2018-05-04 DIAGNOSIS — B070 Plantar wart: Secondary | ICD-10-CM | POA: Diagnosis not present

## 2018-05-04 NOTE — Progress Notes (Signed)
Subjective:   Patient ID: Cheryl Knox, female   DOB: 57 y.o.   MRN: [id number]   HPI Patient presents stating my left ankle is quite a bit better in my right foot I think is improving but still present   ROS      Objective:  Physical Exam  Neurovascular status intact with patient found to have discomfort in left sinus tarsi which seems improved but is still present with keratotic lesions fifth metatarsal right that still show pinpoint bleeding and pain to lateral pressure but improved from previous     Assessment:  Sinus tarsitis left improving with verruca plantaris right     Plan:  H&P both conditions reviewed.  Today I went ahead and I discussed sinus tarsi and the chance for injection at one point in future and for the right I debrided the lesions with a small amount of bleeding which is related to the wart formations clean the area and applied chemical agent to create immune response with sterile dressings.  Placed on topical medicine and reappoint as symptoms indicate

## 2018-06-28 DIAGNOSIS — Z23 Encounter for immunization: Secondary | ICD-10-CM | POA: Diagnosis not present

## 2019-11-11 ENCOUNTER — Ambulatory Visit: Payer: 59 | Attending: Family

## 2019-11-11 ENCOUNTER — Other Ambulatory Visit: Payer: Self-pay

## 2019-11-11 DIAGNOSIS — Z23 Encounter for immunization: Secondary | ICD-10-CM

## 2019-11-11 NOTE — Progress Notes (Signed)
   Covid-19 Vaccination Clinic  Name:  [name]    MRN: [id number] DOB: 1961/05/06  11/11/2019  [name] was observed post Covid-19 immunization for 15 minutes without incidence. She was provided with Vaccine Information Sheet and instruction to access the V-Safe system.   [name] was instructed to call 911 with any severe reactions post vaccine: Marland Kitchen Difficulty breathing  . Swelling of your face and throat  . A fast heartbeat  . A bad rash all over your body  . Dizziness and weakness    Immunizations Administered    Name Date Dose VIS Date Route   Moderna COVID-19 Vaccine 11/11/2019  2:38 PM 0.5 mL 08/17/2019 Intramuscular   Manufacturer: Moderna   [id number]   [id number]

## 2019-12-14 ENCOUNTER — Ambulatory Visit: Payer: 59 | Attending: Family

## 2019-12-14 DIAGNOSIS — Z23 Encounter for immunization: Secondary | ICD-10-CM

## 2019-12-14 NOTE — Progress Notes (Signed)
   Covid-19 Vaccination Clinic  Name:  ADAMARIS CASH    MRN: [id number] DOB: [birth year]  12/14/2019  Ms. Rashed was observed post Covid-19 immunization for 15 minutes without incident. She was provided with Vaccine Information Sheet and instruction to access the V-Safe system.   Ms. Fairbairn was instructed to call 911 with any severe reactions post vaccine: Marland Kitchen Difficulty breathing  . Swelling of face and throat  . A fast heartbeat  . A bad rash all over body  . Dizziness and weakness   Immunizations Administered    Name Date Dose VIS Date Route   Moderna COVID-19 Vaccine 12/14/2019  9:12 AM 0.5 mL 08/17/2019 Intramuscular   Manufacturer: Moderna   [id number]   [id number]

## 2020-01-12 ENCOUNTER — Other Ambulatory Visit: Payer: Self-pay | Admitting: Family Medicine

## 2022-01-01 ENCOUNTER — Encounter: Payer: Self-pay | Admitting: Family Medicine

## 2022-01-01 ENCOUNTER — Ambulatory Visit (INDEPENDENT_AMBULATORY_CARE_PROVIDER_SITE_OTHER): Payer: 59 | Admitting: Family Medicine

## 2022-01-01 VITALS — BP 152/108 | HR 120 | Temp 97.9°F | Wt 195.4 lb

## 2022-01-01 DIAGNOSIS — F411 Generalized anxiety disorder: Secondary | ICD-10-CM

## 2022-01-01 DIAGNOSIS — Z1211 Encounter for screening for malignant neoplasm of colon: Secondary | ICD-10-CM

## 2022-01-01 DIAGNOSIS — Z1231 Encounter for screening mammogram for malignant neoplasm of breast: Secondary | ICD-10-CM

## 2022-01-01 DIAGNOSIS — Z13220 Encounter for screening for lipoid disorders: Secondary | ICD-10-CM

## 2022-01-01 DIAGNOSIS — I10 Essential (primary) hypertension: Secondary | ICD-10-CM | POA: Diagnosis not present

## 2022-01-01 DIAGNOSIS — Z8619 Personal history of other infectious and parasitic diseases: Secondary | ICD-10-CM | POA: Diagnosis not present

## 2022-01-01 DIAGNOSIS — Z949 Transplanted organ and tissue status, unspecified: Secondary | ICD-10-CM

## 2022-01-01 DIAGNOSIS — Z8572 Personal history of non-Hodgkin lymphomas: Secondary | ICD-10-CM

## 2022-01-01 DIAGNOSIS — Z1159 Encounter for screening for other viral diseases: Secondary | ICD-10-CM

## 2022-01-01 DIAGNOSIS — J208 Acute bronchitis due to other specified organisms: Secondary | ICD-10-CM

## 2022-01-01 DIAGNOSIS — B9689 Other specified bacterial agents as the cause of diseases classified elsewhere: Secondary | ICD-10-CM

## 2022-01-01 MED ORDER — LOSARTAN POTASSIUM-HCTZ 50-12.5 MG PO TABS: 1.0000 | ORAL_TABLET | Freq: Every day | ORAL | 3 refills | Status: DC

## 2022-01-01 MED ORDER — CLONAZEPAM 0.5 MG PO TABS: 0.5000 mg | ORAL_TABLET | Freq: Two times a day (BID) | ORAL | 1 refills | Status: DC | PRN

## 2022-01-01 MED ORDER — AMOXICILLIN-POT CLAVULANATE 875-125 MG PO TABS: 1.0000 | ORAL_TABLET | Freq: Two times a day (BID) | ORAL | 0 refills | Status: DC

## 2022-01-01 NOTE — Progress Notes (Addendum)
? ?  Subjective:  ? ? Patient ID: Cheryl Knox, female    DOB: Mar 18, 1961, 61 y.o.   MRN: [id number] ? ?HPI ?She is here for evaluation of a 3-week history that started with URI type symptoms that did get much better however within the last week she has noted increasing cough, rhinorrhea, ear congestion but no fever, chills, sore throat, earache. 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  She has also had difficulty with depression as well as with her blood pressure.  She has not seen anyone in several years.  She was given Zoloft which she did not tolerate very well.  She also has been on several different blood pressure medications but none in over 6 months.  She is under a lot of stress, is in the process of renovating a house that she is buying and trying to sell the 1 that she is in.  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  She is also trying to sell the state of her mother.  She notes in the past that she did the best when she was given clonazepam.  She would like that again. ? ? ?Review of Systems ? ?   ?Objective:  ? Physical Exam ?Alert and in no distress. Tympanic membranes and canals are normal. Pharyngeal area is normal. Neck is supple without adenopathy or thyromegaly. Cardiac exam shows a regular sinus rhythm without murmurs or gallops. Lungs are clear to auscultation. ? ? ? ? ?   ?Assessment & Plan:  ?Primary hypertension - Plan: CBC with Differential/Platelet, Comprehensive metabolic panel, losartan-hydrochlorothiazide (HYZAAR) 50-12.5 MG tablet ? ?Anxiety state - Plan: clonazePAM (KLONOPIN) 0.5 MG tablet ? ?History of shingles ? ?History of organ or tissue transplant ? ?History of lymphoma - Lymphoblastic ? ?Need for hepatitis C screening test - Plan: Hepatitis C antibody ? ?Acute bacterial bronchitis - Plan: amoxicillin-clavulanate (AUGMENTIN) 875-125 MG tablet ? ?Encounter  for screening mammogram for malignant neoplasm of breast - Plan: MM Digital Screening ? ?Screening for colon cancer - Plan: Cologuard ? ?Screening for lipid disorders - Plan: Lipid panel ?Since she has not seen anybody in quite some time I took the opportunity to get blood work, set her up for Pap, mammogram, colon cancer screening.  I will give her clonazepam to help with the stresses that she is dealing with.  She was tried on different BP meds and I will place her back on 1 and recheck here in approximately 1 month. ?I attempted to clean up her medical record but at this point she has a multitude of issues that are related to her underlying lymphoma and we will wait for blood work to clear that up.  Recheck here 1 month. ?

## 2022-01-02 LAB — CBC WITH DIFFERENTIAL/PLATELET
Basophils Absolute: 0.1 10*3/uL (ref 0.0–0.2)
Basos: 1 %
EOS (ABSOLUTE): 0.1 10*3/uL (ref 0.0–0.4)
Eos: 1 %
Hematocrit: 36.4 % (ref 34.0–46.6)
Hemoglobin: 12.3 g/dL (ref 11.1–15.9)
Immature Grans (Abs): 0 10*3/uL (ref 0.0–0.1)
Immature Granulocytes: 0 %
Lymphocytes Absolute: 2.7 10*3/uL (ref 0.7–3.1)
Lymphs: 26 %
MCH: 31.7 pg (ref 26.6–33.0)
MCHC: 33.8 g/dL (ref 31.5–35.7)
MCV: 94 fL (ref 79–97)
Monocytes Absolute: 0.7 10*3/uL (ref 0.1–0.9)
Monocytes: 7 %
Neutrophils Absolute: 7 10*3/uL (ref 1.4–7.0)
Neutrophils: 65 %
Platelets: 353 10*3/uL (ref 150–450)
RBC: 3.88 x10E6/uL (ref 3.77–5.28)
RDW: 12.5 % (ref 11.7–15.4)
WBC: 10.6 10*3/uL (ref 3.4–10.8)

## 2022-01-02 LAB — COMPREHENSIVE METABOLIC PANEL
ALT: 26 IU/L (ref 0–32)
AST: 29 IU/L (ref 0–40)
Albumin/Globulin Ratio: 1.6 (ref 1.2–2.2)
Albumin: 4.3 g/dL (ref 3.8–4.9)
Alkaline Phosphatase: 108 IU/L (ref 44–121)
BUN/Creatinine Ratio: 17 (ref 9–23)
BUN: 21 mg/dL (ref 6–24)
Bilirubin Total: 0.2 mg/dL (ref 0.0–1.2)
CO2: 20 mmol/L (ref 20–29)
Calcium: 9.7 mg/dL (ref 8.7–10.2)
Chloride: 102 mmol/L (ref 96–106)
Creatinine, Ser: 1.24 mg/dL — ABNORMAL HIGH (ref 0.57–1.00)
Globulin, Total: 2.7 g/dL (ref 1.5–4.5)
Glucose: 94 mg/dL (ref 70–99)
Potassium: 4.8 mmol/L (ref 3.5–5.2)
Sodium: 138 mmol/L (ref 134–144)
Total Protein: 7 g/dL (ref 6.0–8.5)
eGFR: 50 mL/min/{1.73_m2} — ABNORMAL LOW (ref >59)

## 2022-01-02 LAB — LIPID PANEL
Chol/HDL Ratio: 6.9 ratio — ABNORMAL HIGH (ref 0.0–4.4)
Cholesterol, Total: 295 mg/dL — ABNORMAL HIGH (ref 100–199)
HDL: 43 mg/dL (ref >39)
LDL Chol Calc (NIH): 214 mg/dL — ABNORMAL HIGH (ref 0–99)
Triglycerides: 196 mg/dL — ABNORMAL HIGH (ref 0–149)
VLDL Cholesterol Cal: 38 mg/dL (ref 5–40)

## 2022-01-02 LAB — HEPATITIS C ANTIBODY: Hep C Virus Ab: NONREACTIVE

## 2022-01-03 LAB — MAGNESIUM: Magnesium: 2 mg/dL (ref 1.6–2.3)

## 2022-01-04 MED ORDER — ROSUVASTATIN CALCIUM 20 MG PO TABS: 20.0000 mg | ORAL_TABLET | Freq: Every day | ORAL | 3 refills | Status: DC

## 2022-01-04 NOTE — Addendum Note (Signed)
Addended by: Denita Lung on: 01/04/2022 12:45 PM ? ? Modules accepted: Orders ? ?

## 2022-01-13 NOTE — Progress Notes (Signed)
? ?Acute Office Visit ? ?Subjective:  ? ? Patient ID: Cheryl Knox, female    DOB: Dec 04, 1960, 61 y.o.   MRN: [id number] ? ?Chief Complaint  ?Patient presents with  ? Gynecologic Exam  ?  Pap only  ? ? ?HPI ?Patient is in today for a pap smear; Reports she is getting over bronchitis and is feeling better, but noticed an uncomfortable feeling in her vaginal area with all of hte coughing that she was doing that has her concerned; denies vaginal bleeding; also states she has not yet done her Cologuard due to the antibiotic she was on causing soft stools and buttock irritation; while on the antibiotic she also reports a very dry mouth and has been making sure that she drinks 60 - 80 ounces of water a day; also reports that she recently started Crestor 20 mg and woke up with a very achy back this morning ? ?Finished antibiotic; had diarrhea and a very dry mouth; is achy in lower back and is wondering if it's from the crestor 20 mg and is drinking 60 - 80 ounces of water;  ? ?Outpatient Medications Prior to Visit  ?Medication Sig Dispense Refill  ? clonazePAM (KLONOPIN) 0.5 MG tablet Take 1 tablet (0.5 mg total) by mouth 2 (two) times daily as needed for anxiety. 30 tablet 1  ? losartan-hydrochlorothiazide (HYZAAR) 50-12.5 MG tablet Take 1 tablet by mouth daily. 90 tablet 3  ? rosuvastatin (CRESTOR) 20 MG tablet Take 1 tablet (20 mg total) by mouth daily. 90 tablet 3  ? magnesium gluconate (MAGONATE) 500 MG tablet Take 500 mg by mouth 2 (two) times daily. (Patient not taking: Reported on 01/14/2022)    ? amLODipine (NORVASC) 5 MG tablet Take 5 mg by mouth daily.    ? amoxicillin-clavulanate (AUGMENTIN) 875-125 MG tablet Take 1 tablet by mouth 2 (two) times daily. (Patient not taking: Reported on 01/14/2022) 20 tablet 0  ? baclofen (LIORESAL) 10 MG tablet Take 20 mg by mouth 2 (two) times daily. (Patient not taking: Reported on 01/01/2022)    ? Fexofenadine HCl (MUCINEX ALLERGY PO) Take by mouth. (Patient not taking:  Reported on 01/14/2022)    ? HYDROmorphone (DILAUDID) 2 MG tablet Take 1 mg by mouth every 4 (four) hours as needed for severe pain. (Patient not taking: Reported on 01/01/2022)    ? metoprolol tartrate (LOPRESSOR) 25 MG tablet Take 1 tablet (25 mg total) by mouth 3 (three) times daily. (Patient not taking: Reported on 01/01/2022) 30 tablet 0  ? [place]  ?Wart Cream-  ?Cimetidine 2%, Deoxy-D-Glucose 0.2%, Fluorouracil-5 5%, Salicylic Acid 62% ?Apply 1-2 grams to affected area 3-4 times daily ?Qty. 120 gm ?3 refills (Patient not taking: Reported on 01/01/2022)    ? Omega-3 Fatty Acids (FISH OIL PO) Take 1 capsule by mouth daily. (Patient not taking: Reported on 01/01/2022)    ? tacrolimus (PROTOPIC) 0.1 % ointment Apply 1 application topically 3 (three) times daily as needed for irritation. (Patient not taking: Reported on 01/01/2022)    ? zolpidem (AMBIEN) 5 MG tablet Take 1 tablet (5 mg total) by mouth at bedtime as needed for sleep. (Patient not taking: Reported on 01/01/2022) 10 tablet 0  ? ?No facility-administered medications prior to visit.  ? ? ?Allergies  ?Allergen Reactions  ? Ondansetron Nausea Only and Other (See Comments)  ?  Stomach upset  ? Morphine Anxiety and Other (See Comments)  ?  Disorientation.  ?Disorientation.   ? ? ?Review of Systems  ?  Constitutional:  Negative for activity change and chills.  ?HENT:  Negative for voice change.   ?Eyes:  Negative for pain and redness.  ?Gastrointestinal:  Negative for constipation, diarrhea, nausea and vomiting.  ?Endocrine: Negative for polyuria.  ?Genitourinary:  Negative for dysuria and frequency.  ?Musculoskeletal:  Positive for back pain.  ?Skin:  Negative for color change and rash.  ?Allergic/Immunologic: Negative for immunocompromised state.  ?Neurological:  Negative for dizziness.  ?Psychiatric/Behavioral:  Negative for agitation.   ? ?   ?Objective:  ?  ?Physical Exam ?Chest:  ?Breasts: ?   Right: Normal. No swelling, inverted nipple,  nipple discharge or tenderness.  ?   Left: Normal. No swelling, inverted nipple, nipple discharge or tenderness.  ?Genitourinary: ?   Pubic Area: No rash.   ?   Vagina: No vaginal discharge.  ?   Comments: Pelvic Exam: ?External Genitalia:  No lesions, BUS negative for discharge ?Vagina:  Cervical os visible in vaginal opening, Pink, good support, normal discharge ?Cervix: No lesions present, negative CMT ?Uterus:  NSSC mobile and non tender ?Adnexa:  No masses or tenderness bilaterally  ?Musculoskeletal:  ?   Lumbar back: Normal range of motion.  ?Lymphadenopathy:  ?   Upper Body:  ?   Right upper body: No axillary or pectoral adenopathy.  ?   Left upper body: No axillary or pectoral adenopathy.  ? ? ?BP 138/88   Pulse (!) 106   Wt 193 lb 3.2 oz (87.6 kg)   SpO2 93%   BMI 33.69 kg/m?  (wearing nail polish) ? ?Wt Readings from Last 3 Encounters:  ?01/14/22 193 lb 3.2 oz (87.6 kg)  ?01/01/22 195 lb 6.4 oz (88.6 kg)  ?07/12/17 203 lb (92.1 kg)  ? ? ?Results for orders placed or performed in visit on 01/01/22  ?CBC with Differential/Platelet  ?Result Value Ref Range  ? WBC 10.6 3.4 - 10.8 x10E3/uL  ? RBC 3.88 3.77 - 5.28 x10E6/uL  ? Hemoglobin 12.3 11.1 - 15.9 g/dL  ? Hematocrit 36.4 34.0 - 46.6 %  ? MCV 94 79 - 97 fL  ? MCH 31.7 26.6 - 33.0 pg  ? MCHC 33.8 31.5 - 35.7 g/dL  ? RDW 12.5 11.7 - 15.4 %  ? Platelets 353 150 - 450 x10E3/uL  ? Neutrophils 65 Not Estab. %  ? Lymphs 26 Not Estab. %  ? Monocytes 7 Not Estab. %  ? Eos 1 Not Estab. %  ? Basos 1 Not Estab. %  ? Neutrophils Absolute 7.0 1.4 - 7.0 x10E3/uL  ? Lymphocytes Absolute 2.7 0.7 - 3.1 x10E3/uL  ? Monocytes Absolute 0.7 0.1 - 0.9 x10E3/uL  ? EOS (ABSOLUTE) 0.1 0.0 - 0.4 x10E3/uL  ? Basophils Absolute 0.1 0.0 - 0.2 x10E3/uL  ? Immature Granulocytes 0 Not Estab. %  ? Immature Grans (Abs) 0.0 0.0 - 0.1 x10E3/uL  ?Comprehensive metabolic panel  ?Result Value Ref Range  ? Glucose 94 70 - 99 mg/dL  ? BUN 21 6 - 24 mg/dL  ? Creatinine, Ser 1.24 (H) 0.57 - 1.00  mg/dL  ? eGFR 50 (L) >59 mL/min/1.73  ? BUN/Creatinine Ratio 17 9 - 23  ? Sodium 138 134 - 144 mmol/L  ? Potassium 4.8 3.5 - 5.2 mmol/L  ? Chloride 102 96 - 106 mmol/L  ? CO2 20 20 - 29 mmol/L  ? Calcium 9.7 8.7 - 10.2 mg/dL  ? Total Protein 7.0 6.0 - 8.5 g/dL  ? Albumin 4.3 3.8 - 4.9 g/dL  ? Globulin, Total  2.7 1.5 - 4.5 g/dL  ? Albumin/Globulin Ratio 1.6 1.2 - 2.2  ? Bilirubin Total 0.2 0.0 - 1.2 mg/dL  ? Alkaline Phosphatase 108 44 - 121 IU/L  ? AST 29 0 - 40 IU/L  ? ALT 26 0 - 32 IU/L  ?Lipid panel  ?Result Value Ref Range  ? Cholesterol, Total 295 (H) 100 - 199 mg/dL  ? Triglycerides 196 (H) 0 - 149 mg/dL  ? HDL 43 >39 mg/dL  ? VLDL Cholesterol Cal 38 5 - 40 mg/dL  ? LDL Chol Calc (NIH) 214 (H) 0 - 99 mg/dL  ? Lipid Comment: Comment   ? Chol/HDL Ratio 6.9 (H) 0.0 - 4.4 ratio  ?Hepatitis C antibody  ?Result Value Ref Range  ? Hep C Virus Ab Non Reactive Non Reactive  ?Magnesium  ?Result Value Ref Range  ? Magnesium 2.0 1.6 - 2.3 mg/dL  ? ? ?   ?Assessment & Plan:  ?1. Women's annual routine gynecological examination ?- breast exam and pap smear done, patient will schedule appointment for a mammogram ? ?2. Cervical cancer screening ?- Cytology - PAP ? ?3. Other female genital prolapse ?- Ambulatory referral to Gynecology ?- patient reassured ? ?4. Mixed hyperlipidemia ?- advised for her to try OTC Tylenol and OTC Voltaren Gel for her back pain and follow up with Dr. Redmond School in 2 weeks to discuss if statin is the cause of her back pain ? ? ?No orders of the defined types were placed in this encounter. ? ? ?Return for Return as Already Scheduled. ? ?Irene Pap, PA-C ?

## 2022-01-14 ENCOUNTER — Ambulatory Visit (INDEPENDENT_AMBULATORY_CARE_PROVIDER_SITE_OTHER): Payer: 59 | Admitting: Physician Assistant

## 2022-01-14 ENCOUNTER — Encounter: Payer: Self-pay | Admitting: Physician Assistant

## 2022-01-14 ENCOUNTER — Other Ambulatory Visit (HOSPITAL_COMMUNITY)
Admission: RE | Admit: 2022-01-14 | Discharge: 2022-01-14 | Disposition: A | Discharge status: Home / Self Care | Payer: 59 | Source: Ambulatory Visit | Attending: Physician Assistant | Admitting: Physician Assistant

## 2022-01-14 VITALS — BP 138/88 | HR 106 | Wt 193.2 lb

## 2022-01-14 DIAGNOSIS — N8189 Other female genital prolapse: Secondary | ICD-10-CM | POA: Diagnosis not present

## 2022-01-14 DIAGNOSIS — E782 Mixed hyperlipidemia: Secondary | ICD-10-CM

## 2022-01-14 DIAGNOSIS — Z124 Encounter for screening for malignant neoplasm of cervix: Secondary | ICD-10-CM

## 2022-01-14 DIAGNOSIS — Z01419 Encounter for gynecological examination (general) (routine) without abnormal findings: Secondary | ICD-10-CM

## 2022-01-14 NOTE — Patient Instructions (Signed)
You will get a call to schedule an appointment with Gynecology ? ?

## 2022-01-16 ENCOUNTER — Ambulatory Visit (INDEPENDENT_AMBULATORY_CARE_PROVIDER_SITE_OTHER): Payer: 59 | Admitting: Obstetrics & Gynecology

## 2022-01-16 ENCOUNTER — Encounter: Payer: Self-pay | Admitting: Obstetrics & Gynecology

## 2022-01-16 VITALS — BP 124/82 | HR 120 | Resp 16 | Ht 62.75 in | Wt 193.0 lb

## 2022-01-16 DIAGNOSIS — N8110 Cystocele, unspecified: Secondary | ICD-10-CM

## 2022-01-16 DIAGNOSIS — L918 Other hypertrophic disorders of the skin: Secondary | ICD-10-CM

## 2022-01-16 LAB — CYTOLOGY - PAP: Diagnosis: NEGATIVE

## 2022-01-16 NOTE — Progress Notes (Signed)
? ? ?  Cheryl Knox 1960/12/02 [id number] ? ? ?     61 y.o.  G0 ? ?RP: Vaginal bulge felt while showering ? ?HPI: Vaginal bulge felt while showering noticed very recently.  No vaginal or pelvic pain.  No PMB.  Urine normal, no incontinence or retention.  BMs normal.  Skin tag on pubis. ? ? ?OB History  ?Gravida Para Term Preterm AB Living  ?0 0 0 0 0 0  ?SAB IAB Ectopic Multiple Live Births  ?0 0 0 0 0  ? ? ?Past medical history,surgical history, problem list, medications, allergies, family history and social history were all reviewed and documented in the EPIC chart. ? ? ?Directed ROS with pertinent positives and negatives documented in the history of present illness/assessment and plan. ? ?Exam: ? ?Vitals:  ? 01/16/22 1338  ?BP: 124/82  ?Pulse: (!) 120  ?Resp: 16  ?Weight: 193 lb (87.5 kg)  ?Height: 5' 2.75" (1.594 m)  ? ?General appearance:  Normal ? ? ?Gynecologic exam: Vulva normal.  Skin tag at the mid pubis. Bimanual exam:  Uterus AV, normal volume, mobile, NT.  No adnexal mass, NT.  Gyn exam standing with Valsalva:  Cystocele grade 3/4.  No uterine prolapse.  No rectocele.   ? ? ?Assessment/Plan:  62 y.o. G0 ? ?1. Baden-Walker grade 3 cystocele ?Vaginal bulge felt while showering noticed very recently.  No vaginal or pelvic pain.  No PMB.  Urine normal, no incontinence or retention.  BMs normal.  Gyn exam standing with Valsalva:  Cystocele grade 3/4.  No uterine prolapse.  No rectocele.  Counseling on Cystocele.  Prevention of progression discussed.  Recommend avoidance of pelvic floor pressure.  Regular urination to avoid overfilling of the bladder.  Kegel exercises.  PT for pelvic floor reinforcement.  Management with pessary and surgical corrections reviewed, but not recommended at this time. ? ?2. Skin tag ?Skin tag on pubis. F/U excision. ? ?Other orders ?- Multiple Vitamin (MULTIVITAMIN PO); Take by mouth. With iron  ? ?Princess Bruins MD, 2:07 PM 01/16/2022 ? ? ? ?  ?

## 2022-01-20 ENCOUNTER — Encounter: Payer: Self-pay | Admitting: Obstetrics & Gynecology

## 2022-01-31 ENCOUNTER — Ambulatory Visit (INDEPENDENT_AMBULATORY_CARE_PROVIDER_SITE_OTHER): Payer: 59 | Admitting: Family Medicine

## 2022-01-31 ENCOUNTER — Encounter: Payer: Self-pay | Admitting: Family Medicine

## 2022-01-31 VITALS — BP 138/96 | HR 118 | Temp 97.2°F | Wt 196.8 lb

## 2022-01-31 DIAGNOSIS — I10 Essential (primary) hypertension: Secondary | ICD-10-CM | POA: Diagnosis not present

## 2022-01-31 DIAGNOSIS — E669 Obesity, unspecified: Secondary | ICD-10-CM | POA: Diagnosis not present

## 2022-01-31 DIAGNOSIS — G479 Sleep disorder, unspecified: Secondary | ICD-10-CM

## 2022-01-31 MED ORDER — LOSARTAN POTASSIUM-HCTZ 100-12.5 MG PO TABS: 1.0000 | ORAL_TABLET | Freq: Every day | ORAL | 0 refills | Status: DC

## 2022-01-31 MED ORDER — TRAZODONE HCL 50 MG PO TABS: 25.0000 mg | ORAL_TABLET | Freq: Every evening | ORAL | 1 refills | Status: DC | PRN

## 2022-01-31 NOTE — Progress Notes (Signed)
   Subjective:    Patient ID: Cheryl Knox, female    DOB: Jan 17, 1961, 61 y.o.   MRN: [id number]  HPI She is here for a recheck.  She has been taking Crestor for the last several weeks and has not had any difficulties with that.  She is also taking Hyzaar.  She has had some difficulty with sleep due to the fact she is trying to get her house ready to be sold and has blood in the house and is in the process of fixing it up.   Review of Systems     Objective:   Physical Exam  Alert and in no distress.  Blood pressure is recorded      Assessment & Plan:  Primary hypertension - Plan: losartan-hydrochlorothiazide (HYZAAR) 100-12.5 MG tablet  Sleep disturbance - Plan: traZODone (DESYREL) 50 MG tablet  Obesity (BMI 30-39.9) She states that trazodone has worked in the past.  We will have her start with half dosing.  Hopefully when her life quiets down after she sells one house and moves and the other things will be better.  We will also increase her blood pressure medication.  We then briefly discussed weight loss and she is to check to see if her insurance will cover for [facility].

## 2022-02-18 ENCOUNTER — Ambulatory Visit: Payer: 59

## 2022-02-26 ENCOUNTER — Ambulatory Visit
Admission: RE | Admit: 2022-02-26 | Discharge: 2022-02-26 | Disposition: A | Discharge status: Home / Self Care | Payer: 59 | Source: Ambulatory Visit | Attending: Family Medicine | Admitting: Family Medicine

## 2022-02-26 DIAGNOSIS — Z1231 Encounter for screening mammogram for malignant neoplasm of breast: Secondary | ICD-10-CM

## 2022-03-22 ENCOUNTER — Ambulatory Visit: Payer: 59 | Admitting: Obstetrics & Gynecology

## 2022-04-08 ENCOUNTER — Encounter: Payer: Self-pay | Admitting: Obstetrics & Gynecology

## 2022-04-08 ENCOUNTER — Ambulatory Visit (INDEPENDENT_AMBULATORY_CARE_PROVIDER_SITE_OTHER): Payer: 59 | Admitting: Obstetrics & Gynecology

## 2022-04-08 VITALS — BP 138/88 | HR 96 | Resp 16 | Ht 63.0 in | Wt 202.0 lb

## 2022-04-08 DIAGNOSIS — Z4689 Encounter for fitting and adjustment of other specified devices: Secondary | ICD-10-CM

## 2022-04-08 DIAGNOSIS — N8110 Cystocele, unspecified: Secondary | ICD-10-CM

## 2022-04-08 NOTE — Progress Notes (Signed)
    ANNIECE BLEILER [birth year] [id number]        61 y.o.  G0  RP: Symptomatic Cystocele grade 3/4   HPI: Vaginal bulge felt while showering noticed at the end of 12/2021.  Seen by me on 01/16/22 Cystocele grade 3/4 diagnosed with some stress urinary incontinence.  Started doing Kegel exercises since that time, no longer having leakage of urine, but still feeling a very uncomfortable bulge in the vagina and lower back pain. Urine normal, no retention.  BMs normal.  Postmenopause, well on no HRT.  No PMB.  Abstinent.   OB History  Gravida Para Term Preterm AB Living  0 0 0 0 0 0  SAB IAB Ectopic Multiple Live Births  0 0 0 0 0    Past medical history,surgical history, problem list, medications, allergies, family history and social history were all reviewed and documented in the EPIC chart.   Directed ROS with pertinent positives and negatives documented in the history of present illness/assessment and plan.  Exam:  Vitals:   04/08/22 1153  BP: 138/88  Pulse: 96  Resp: 16  Weight: 202 lb (91.6 kg)  Height: '5\' 3"'$  (1.6 m)   General appearance:  Normal  Abdomen: Normal  Gynecologic exam:  Vulva normal.  Vaginal exam confirms a Cystocele grade 3/4 with Valsalva.  Vaginal mucosa intact.  Milex ring #3 with support fitted in.  Excellent fit.  Patient went to the bathroom, did valsalva and squatting, and the Milex ring #3 with support remained in good location in the vagina.  No vaginal or pelvic pain, feels very good with the pessary in place.  Pessary changed to patient's pessary as we had a Milex ring #3 with support available.     Assessment/Plan:  61 y.o. G0P0000   1. Baden-Walker grade 3 cystocele Vaginal bulge felt while showering noticed at the end of 12/2021.  Seen by me on 01/16/22 Cystocele grade 3/4 diagnosed with some stress urinary incontinence.  Started doing Kegel exercises since that time, no longer having leakage of urine, but still feeling a very uncomfortable bulge in  the vagina and lower back pain. Urine normal, no retention.  BMs normal.  Postmenopause, well on no HRT.  No PMB.  Abstinent. Vaginal exam confirms a Cystocele grade 3/4 with Valsalva.  Vaginal mucosa intact.  Milex ring #3 with support fitted in.  Excellent fit.  Patient went to the bathroom, did valsalva and squatting, and the Milex ring #3 with support remained in good location in the vagina.  No vaginal or pelvic pain, feels very good with the pessary in place.  Pessary changed to patient's pessary as we had a Milex ring #3 with support available.   Information and precautions about pessary management reviewed.  F/U in 1 month for pessary maintenance.  2. Encounter for fitting and adjustment of pessary  Decision to manage with a pessary.  Excellent fit with Milex ring #3 with support.  Princess Bruins MD, 11:59 AM 04/08/2022

## 2022-04-15 ENCOUNTER — Other Ambulatory Visit: Payer: Self-pay | Admitting: Family Medicine

## 2022-04-15 DIAGNOSIS — F411 Generalized anxiety disorder: Secondary | ICD-10-CM

## 2022-04-15 NOTE — Telephone Encounter (Signed)
Is this okay to refill? 

## 2022-04-16 LAB — COLOGUARD: COLOGUARD: NEGATIVE

## 2022-05-08 ENCOUNTER — Other Ambulatory Visit: Payer: Self-pay | Admitting: Family Medicine

## 2022-05-08 DIAGNOSIS — I10 Essential (primary) hypertension: Secondary | ICD-10-CM

## 2022-05-14 ENCOUNTER — Ambulatory Visit: Payer: 59 | Admitting: Obstetrics & Gynecology

## 2022-05-16 ENCOUNTER — Encounter: Payer: Self-pay | Admitting: Obstetrics & Gynecology

## 2022-05-16 ENCOUNTER — Ambulatory Visit (INDEPENDENT_AMBULATORY_CARE_PROVIDER_SITE_OTHER): Payer: 59 | Admitting: Obstetrics & Gynecology

## 2022-05-16 VITALS — BP 114/70 | HR 84 | Resp 16

## 2022-05-16 DIAGNOSIS — N393 Stress incontinence (female) (male): Secondary | ICD-10-CM | POA: Diagnosis not present

## 2022-05-16 DIAGNOSIS — N8110 Cystocele, unspecified: Secondary | ICD-10-CM

## 2022-05-16 DIAGNOSIS — Z4689 Encounter for fitting and adjustment of other specified devices: Secondary | ICD-10-CM | POA: Diagnosis not present

## 2022-05-16 NOTE — Progress Notes (Signed)
    MACALA BALDONADO 1960-11-20 [id number]        61 y.o.  G0 Widow x age 61.  H/O Lymphoma at age 61/Bone marrow transplant 8 years ago.  RP: Pessary maintenance  HPI: Cystocele grade 3/4 with Milex ring #3 with support x 04/08/22.  Doing very well with it.  No vaginal discharge, no bleeding, no vaginal or pelvic pain.  SUI much better, only leaking when laughing hard.     OB History  Gravida Para Term Preterm AB Living  0 0 0 0 0 0  SAB IAB Ectopic Multiple Live Births  0 0 0 0 0    Past medical history,surgical history, problem list, medications, allergies, family history and social history were all reviewed and documented in the EPIC chart.   Directed ROS with pertinent positives and negatives documented in the history of present illness/assessment and plan.  Exam:  Vitals:   05/16/22 0824  BP: 114/70  Pulse: 84  Resp: 16   General appearance:  Normal  Gynecologic exam: Vulva normal.  Pessary removed easily and cleaned.  Minimal discharge, no blood.  Vaginal mucosa intact.  Pessary put back in place in the vagina.  Very good fit.   Assessment/Plan:  61 y.o. G0  1. Encounter for pessary maintenance Cystocele grade 3/4 with Milex ring #3 with support x 04/08/22.  Doing very well with it.  No vaginal discharge, no bleeding, no vaginal or pelvic pain.  SUI much better, only leaking when laughing hard.  Very well with pessary, excellent fit.  Will continue Kegels.  May refer to PT if needed in the future.  F/U 4 months for Annual Gyn exam and pessary maintenance.  2. Baden-Walker grade 3 cystocele As above.  3. SUI (stress urinary incontinence, female)  Increase Kegels.  May refer to PT as needed in the future.  Princess Bruins MD, 8:43 AM 05/16/2022

## 2022-05-22 ENCOUNTER — Encounter: Payer: Self-pay | Admitting: Internal Medicine

## 2022-06-25 ENCOUNTER — Encounter: Payer: Self-pay | Admitting: Internal Medicine

## 2022-06-27 ENCOUNTER — Other Ambulatory Visit (INDEPENDENT_AMBULATORY_CARE_PROVIDER_SITE_OTHER): Payer: 59

## 2022-06-27 DIAGNOSIS — Z23 Encounter for immunization: Secondary | ICD-10-CM

## 2022-07-08 ENCOUNTER — Encounter: Payer: Self-pay | Admitting: Internal Medicine

## 2022-08-19 ENCOUNTER — Other Ambulatory Visit: Payer: Self-pay | Admitting: Medical

## 2022-08-19 DIAGNOSIS — I10 Essential (primary) hypertension: Secondary | ICD-10-CM

## 2022-09-12 ENCOUNTER — Ambulatory Visit: Payer: 59 | Admitting: Obstetrics & Gynecology

## 2022-10-14 ENCOUNTER — Ambulatory Visit: Payer: 59 | Admitting: Obstetrics & Gynecology

## 2023-07-10 ENCOUNTER — Ambulatory Visit: Payer: 59 | Admitting: Family Medicine

## 2023-07-10 VITALS — BP 130/84 | HR 63 | Wt 184.2 lb

## 2023-07-10 DIAGNOSIS — Z23 Encounter for immunization: Secondary | ICD-10-CM

## 2023-07-10 DIAGNOSIS — F411 Generalized anxiety disorder: Secondary | ICD-10-CM | POA: Diagnosis not present

## 2023-07-10 DIAGNOSIS — E669 Obesity, unspecified: Secondary | ICD-10-CM

## 2023-07-10 DIAGNOSIS — I10 Essential (primary) hypertension: Secondary | ICD-10-CM

## 2023-07-10 DIAGNOSIS — M461 Sacroiliitis, not elsewhere classified: Secondary | ICD-10-CM

## 2023-07-10 DIAGNOSIS — E782 Mixed hyperlipidemia: Secondary | ICD-10-CM

## 2023-07-10 MED ORDER — TRAMADOL HCL 50 MG PO TABS: 50.0000 mg | ORAL_TABLET | Freq: Three times a day (TID) | ORAL | 0 refills | Status: AC | PRN

## 2023-07-10 MED ORDER — ROSUVASTATIN CALCIUM 20 MG PO TABS: 20.0000 mg | ORAL_TABLET | Freq: Every day | ORAL | 3 refills | Status: DC

## 2023-07-10 MED ORDER — CLONAZEPAM 0.5 MG PO TABS: 0.5000 mg | ORAL_TABLET | Freq: Two times a day (BID) | ORAL | 0 refills | Status: DC | PRN

## 2023-07-10 MED ORDER — LOSARTAN POTASSIUM-HCTZ 100-12.5 MG PO TABS: 1.0000 | ORAL_TABLET | Freq: Every day | ORAL | 0 refills | Status: DC

## 2023-07-10 NOTE — Patient Instructions (Signed)
2 extra strength Tylenol 3 times per day and you can also take 2 Aleve twice per day Heat for 20 minutes 3 times per day after you do the heat and then do some gentle stretching

## 2023-07-10 NOTE — Progress Notes (Signed)
Subjective:    Patient ID: Cheryl Knox, female    DOB: 03/23/1961, 62 y.o.   MRN: [id number]  HPI She states that 2 weeks ago she developed some left-sided low back pain and was seen in an urgent care center.  She was given a steroid Dosepak as well as Flexeril and told she had sciatica.  She is here for follow-up on that.  She does complain of radiation of the pain down the lateral aspect of her leg but no weakness numbness or tingling. She would also like a refill on her Klonopin.  Review of epic indicates she rarely uses this. She also needs several of her medications renewed.  She has not had blood work in quite some time.  Review of Systems     Objective:    Physical Exam Normal lumbar curve with normal motion.  Tenderness to palpation over the mid left SI joint.  FABER testing was negative.  Negative straight leg raising and sciatic stretch.  Full hip motion.  Normal DTRs.       Assessment & Plan:  COVID-19 vaccine administered - Plan: Pfizer Comirnaty Covid -19 Vaccine 9yrs and older  Needs flu shot - Plan: Flu vaccine trivalent PF, 6mos and older(Flulaval,Afluria,Fluarix,Fluzone)  Primary hypertension - Plan: losartan-hydrochlorothiazide (HYZAAR) 100-12.5 MG tablet  Anxiety state - Plan: clonazePAM (KLONOPIN) 0.5 MG tablet  Sacroiliitis (HCC) - Plan: traMADol (ULTRAM) 50 MG tablet  Obesity (BMI 30-39.9) - Plan: CBC with Differential/Platelet, Comprehensive metabolic panel, Lipid panel  Mixed hyperlipidemia - Plan: rosuvastatin (CRESTOR) 20 MG tablet I explained that she does not have sciatica and this is a sacroiliitis and recommend heat, stretching, Tylenol, anti-inflammatories and the possible use of tramadol.  Demonstrated stretching exercises to do after he she does the heat.  Tramadol given in case she needs more pain relief.  If she continues to have difficulty she will call for a recheck.  Immunizations were given. The use of record indicates she rarely uses  Klonopin so I am fine with giving it to her.

## 2023-07-11 LAB — CBC WITH DIFFERENTIAL/PLATELET
Basophils Absolute: 0.1 10*3/uL (ref 0.0–0.2)
Basos: 1 %
EOS (ABSOLUTE): 0.1 10*3/uL (ref 0.0–0.4)
Eos: 1 %
Hematocrit: 42 % (ref 34.0–46.6)
Hemoglobin: 13.6 g/dL (ref 11.1–15.9)
Immature Grans (Abs): 0 10*3/uL (ref 0.0–0.1)
Immature Granulocytes: 0 %
Lymphocytes Absolute: 3 10*3/uL (ref 0.7–3.1)
Lymphs: 29 %
MCH: 31.2 pg (ref 26.6–33.0)
MCHC: 32.4 g/dL (ref 31.5–35.7)
MCV: 96 fL (ref 79–97)
Monocytes Absolute: 0.6 10*3/uL (ref 0.1–0.9)
Monocytes: 6 %
Neutrophils Absolute: 6.5 10*3/uL (ref 1.4–7.0)
Neutrophils: 63 %
Platelets: 310 10*3/uL (ref 150–450)
RBC: 4.36 x10E6/uL (ref 3.77–5.28)
RDW: 12.6 % (ref 11.7–15.4)
WBC: 10.3 10*3/uL (ref 3.4–10.8)

## 2023-07-11 LAB — COMPREHENSIVE METABOLIC PANEL
ALT: 15 [IU]/L (ref 0–32)
AST: 22 [IU]/L (ref 0–40)
Albumin: 4.1 g/dL (ref 3.9–4.9)
Alkaline Phosphatase: 90 [IU]/L (ref 44–121)
BUN/Creatinine Ratio: 20 (ref 12–28)
BUN: 29 mg/dL — ABNORMAL HIGH (ref 8–27)
Bilirubin Total: 0.3 mg/dL (ref 0.0–1.2)
CO2: 24 mmol/L (ref 20–29)
Calcium: 9.4 mg/dL (ref 8.7–10.3)
Chloride: 95 mmol/L — ABNORMAL LOW (ref 96–106)
Creatinine, Ser: 1.47 mg/dL — ABNORMAL HIGH (ref 0.57–1.00)
Globulin, Total: 2.5 g/dL (ref 1.5–4.5)
Glucose: 87 mg/dL (ref 70–99)
Potassium: 4.5 mmol/L (ref 3.5–5.2)
Sodium: 137 mmol/L (ref 134–144)
Total Protein: 6.6 g/dL (ref 6.0–8.5)
eGFR: 40 mL/min/{1.73_m2} — ABNORMAL LOW (ref >59)

## 2023-07-11 LAB — LIPID PANEL
Chol/HDL Ratio: 8.3 ratio — ABNORMAL HIGH (ref 0.0–4.4)
Cholesterol, Total: 384 mg/dL — ABNORMAL HIGH (ref 100–199)
HDL: 46 mg/dL (ref >39)
LDL Chol Calc (NIH): 294 mg/dL — ABNORMAL HIGH (ref 0–99)
Triglycerides: 207 mg/dL — ABNORMAL HIGH (ref 0–149)
VLDL Cholesterol Cal: 44 mg/dL — ABNORMAL HIGH (ref 5–40)

## 2023-07-11 MED ORDER — ROSUVASTATIN CALCIUM 40 MG PO TABS: 40.0000 mg | ORAL_TABLET | Freq: Every day | ORAL | 3 refills | Status: DC

## 2023-07-11 NOTE — Addendum Note (Signed)
Addended by: Ronnald Nian on: 07/11/2023 02:24 PM   Modules accepted: Orders

## 2023-07-15 ENCOUNTER — Other Ambulatory Visit: Payer: Self-pay | Admitting: Family Medicine

## 2023-07-15 DIAGNOSIS — K3189 Other diseases of stomach and duodenum: Secondary | ICD-10-CM

## 2023-07-15 MED ORDER — HYOSCYAMINE SULFATE ER 0.375 MG PO TB12: 0.3750 mg | ORAL_TABLET | Freq: Two times a day (BID) | ORAL | 0 refills | Status: DC

## 2023-07-15 NOTE — Progress Notes (Signed)
She has been using melatonin to help treat sacroiliitis and it did cause some GI irritation.  I would recommend that she take the Levbid as well as Prilosec.

## 2023-07-30 ENCOUNTER — Ambulatory Visit: Payer: 59 | Admitting: Medical

## 2023-07-30 VITALS — BP 128/84 | HR 116 | Wt 182.4 lb

## 2023-07-30 DIAGNOSIS — R1010 Upper abdominal pain, unspecified: Secondary | ICD-10-CM

## 2023-07-30 DIAGNOSIS — K589 Irritable bowel syndrome without diarrhea: Secondary | ICD-10-CM

## 2023-07-30 NOTE — Progress Notes (Signed)
Subjective:  Cheryl Knox is a 62 y.o. female who presents for Chief Complaint  Patient presents with   Abdominal Pain    Stomach spasms x 2 weeks. Has lived off chicken broth and ensure. Since last night she has not had any spasms and got a good night sleep and feels fine but just wants to make sure everything is ok.    Here for abdominal pain.  She saw Dr. Susann Givens on 07/10/2023 for back pain.  At that time she used about a weeks worth of Tylenol 500 mg, 2 tablets twice daily along with 2 over-the-counter Aleve twice daily for about a week.  Ever since doing that medication she has had abdominal pain, spasms, doubling over in pain.  She called back in and was advised to use some hyoscyamine which she did.  She is pretty much had pain up until yesterday.  She has been living chicken broth and Ensure for about a week.  Cannot keep solid food down due to the pain.  She was not having abdominal pain prior to 07/10/2023.  So far last night and this morning has been the first time she has been able to eat solid food in the past week or so.  Currently not having any pain today.  No vomiting, fever, body aches or chills.  She felt more stamina at work yesterday.  She is just a little scared to eat solid food a lot until getting checked out.  She was also on a steroid pack prior to seeing Dr. Susann Givens for back pain  No other aggravating or relieving factors.    No other c/o.  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 at time of NHL diagnosis, s/p tube placement per patient.   Shortness  of breath    Current Outpatient Medications on File Prior to Visit  Medication Sig Dispense Refill   losartan-hydrochlorothiazide (HYZAAR) 100-12.5 MG tablet Take 1 tablet by mouth daily. 90 tablet 0   rosuvastatin (CRESTOR) 40 MG tablet Take 1 tablet (40 mg total) by mouth daily. 90 tablet 3   clonazePAM (KLONOPIN) 0.5 MG tablet Take 1 tablet (0.5 mg total) by mouth 2 (two) times daily as needed for anxiety. (Patient not taking: Reported on 07/30/2023) 30 tablet 0   hyoscyamine (LEVBID) 0.375 MG 12 hr tablet Take 1 tablet (0.375 mg total) by mouth 2 (two) times daily. (Patient not taking: Reported on 07/30/2023) 12 tablet 0   magnesium gluconate (MAGONATE) 500 MG tablet Take 500 mg by mouth 2 (two) times daily. (Patient not taking: Reported on 07/30/2023)     Multiple Vitamin (MULTIVITAMIN PO) Take by mouth. With iron (Patient not taking: Reported on 07/30/2023)     No current facility-administered medications on file prior to visit.     The following portions of the patient's history were reviewed and updated as appropriate: allergies, current medications, past family history, past medical history, past social history, past surgical history and problem list.  ROS Otherwise as in  subjective above    Objective: BP 128/84   Pulse (!) 116   Wt 182 lb 6.4 oz (82.7 kg)   BMI 32.31 kg/m   Wt Readings from Last 3 Encounters:  07/30/23 182 lb 6.4 oz (82.7 kg)  07/10/23 184 lb 3.2 oz (83.6 kg)  04/08/22 202 lb (91.6 kg)   BP Readings from Last 3 Encounters:  07/30/23 128/84  07/10/23 130/84  05/16/22 114/70    General appearance: alert, no distress, well developed, well nourished Lungs: CTA bilaterally, no wheezes, rhonchi, or rales Abdomen: +bs, soft, non tender, non distended, no masses, no hepatomegaly, no splenomegaly Pulses: 2+ radial pulses, 2+ pedal pulses, normal cap refill Ext: no edema   Assessment: Encounter Diagnoses  Name Primary?   Pain of upper abdomen Yes    Spasm of bowel      Plan: So far last night and this morning she is symptom-free.  Given her recent heavy duty use of Tylenol and Aleve twice a day for a solid week, she may have had some gastritis.  Discussed other potential differential of abdominal pain.  Advised if her symptoms start back up tonight or tomorrow to use either over-the-counter and acid or Pepto-Bismol or alternate both of them.  We will call back with labs tomorrow.  If symptoms gradually get worse again we may need to consider other labs and imaging  At this point she has discontinued Aleve and Tylenol  Advance diet slowly if symptoms continue to abstain  Cheryl "[name]" was seen today for abdominal pain.  Diagnoses and all orders for this visit:  Pain of upper abdomen -     Comprehensive metabolic panel  Spasm of bowel    Follow up: pending labs

## 2023-07-31 LAB — COMPREHENSIVE METABOLIC PANEL
ALT: 13 [IU]/L (ref 0–32)
AST: 19 [IU]/L (ref 0–40)
Albumin: 4.1 g/dL (ref 3.9–4.9)
Alkaline Phosphatase: 95 [IU]/L (ref 44–121)
BUN/Creatinine Ratio: 22 (ref 12–28)
BUN: 40 mg/dL — ABNORMAL HIGH (ref 8–27)
Bilirubin Total: <0.2 mg/dL (ref 0.0–1.2)
CO2: 24 mmol/L (ref 20–29)
Calcium: 9.4 mg/dL (ref 8.7–10.3)
Chloride: 100 mmol/L (ref 96–106)
Creatinine, Ser: 1.84 mg/dL — ABNORMAL HIGH (ref 0.57–1.00)
Globulin, Total: 2.5 g/dL (ref 1.5–4.5)
Glucose: 101 mg/dL — ABNORMAL HIGH (ref 70–99)
Potassium: 4.4 mmol/L (ref 3.5–5.2)
Sodium: 140 mmol/L (ref 134–144)
Total Protein: 6.6 g/dL (ref 6.0–8.5)
eGFR: 31 mL/min/{1.73_m2} — ABNORMAL LOW (ref >59)

## 2023-07-31 NOTE — Progress Notes (Signed)
Your labs show abnormal kidney function.  It was a little abnormal year ago but worse in the short-term.  Liver and electrolytes thankfully normal.  Due to abnormal kidney function, and in order to protect your kidneys, I recommend you avoid medications that can harm the kidneys such as ibuprofen, Aleve, Advil, Motrin, Naprosyn, or prescription anti-inflammatories which are used for pain, inflammation, and arthritis.   You should avoid dehydration which can harm the kidneys.  Assuming your symptoms improve from where we talked yesterday then I want you to focus on drinking at least 120 ounces of water daily.  If not improving in the next day or 2 then we might need to go ahead and pursue other testing  I do recommend you follow-up with Dr. Susann Givens in a few weeks to recheck the kidney marker and to discuss any other intervention regarding kidney status

## 2023-08-13 ENCOUNTER — Ambulatory Visit: Payer: 59 | Admitting: Family Medicine

## 2023-08-20 ENCOUNTER — Ambulatory Visit: Payer: 59 | Admitting: Family Medicine

## 2023-08-20 ENCOUNTER — Encounter: Payer: Self-pay | Admitting: Family Medicine

## 2023-08-20 VITALS — BP 122/84 | HR 106 | Temp 97.7°F | Wt 184.6 lb

## 2023-08-20 DIAGNOSIS — N289 Disorder of kidney and ureter, unspecified: Secondary | ICD-10-CM | POA: Diagnosis not present

## 2023-08-20 DIAGNOSIS — E782 Mixed hyperlipidemia: Secondary | ICD-10-CM

## 2023-08-20 NOTE — Progress Notes (Signed)
   Subjective:    Patient ID: Cheryl Knox, female    DOB: 1961/08/20, 62 y.o.   MRN: [id number]  HPI She is here for a recheck.  She has had GI issues recently but these have quieted right down.  Review of the record indicates that her renal function was abnormal and will need to be followed up on.  She has been drinking fluids regularly to try and help with this issue.  Also her lipid panel was quite elevated and she has been on a statin for about 2 months.   Review of Systems     Objective:    Physical Exam Alert and in no distress otherwise not examined       Assessment & Plan:  Mixed hyperlipidemia - Plan: Lipid panel  Abnormal renal function - Plan: CBC with Differential/Platelet, Comprehensive metabolic panel

## 2023-08-21 LAB — LIPID PANEL
Cholesterol, Total: 185 mg/dL (ref 100–199)
HDL: 49 mg/dL (ref >39)
LDL CALC COMMENT:: 3.8 ratio (ref 0.0–4.4)
LDL Chol Calc (NIH): 116 mg/dL — ABNORMAL HIGH (ref 0–99)
Triglycerides: 111 mg/dL (ref 0–149)
VLDL Cholesterol Cal: 20 mg/dL (ref 5–40)

## 2023-08-21 LAB — COMPREHENSIVE METABOLIC PANEL
ALT: 16 IU/L (ref 0–32)
AST: 22 IU/L (ref 0–40)
Albumin: 4.1 g/dL (ref 3.9–4.9)
Alkaline Phosphatase: 93 IU/L (ref 44–121)
BUN/Creatinine Ratio: 16 (ref 12–28)
BUN: 19 mg/dL (ref 8–27)
Bilirubin Total: 0.3 mg/dL (ref 0.0–1.2)
CO2: 23 mmol/L (ref 20–29)
Calcium: 9.2 mg/dL (ref 8.7–10.3)
Chloride: 99 mmol/L (ref 96–106)
Creatinine, Ser: 1.16 mg/dL — ABNORMAL HIGH (ref 0.57–1.00)
Globulin, Total: 2.3 g/dL (ref 1.5–4.5)
Glucose: 82 mg/dL (ref 70–99)
Potassium: 4.6 mmol/L (ref 3.5–5.2)
Sodium: 136 mmol/L (ref 134–144)
Total Protein: 6.4 g/dL (ref 6.0–8.5)
eGFR: 54 mL/min/{1.73_m2} — ABNORMAL LOW (ref >59)

## 2023-08-21 LAB — CBC WITH DIFFERENTIAL/PLATELET
Basophils Absolute: 0.1 10*3/uL (ref 0.0–0.2)
Basos: 1 %
EOS (ABSOLUTE): 0.1 10*3/uL (ref 0.0–0.4)
Eos: 1 %
Hematocrit: 30.5 % — ABNORMAL LOW (ref 34.0–46.6)
Hemoglobin: 10.1 g/dL — ABNORMAL LOW (ref 11.1–15.9)
Immature Grans (Abs): 0 10*3/uL (ref 0.0–0.1)
Immature Granulocytes: 0 %
Lymphocytes Absolute: 1.8 10*3/uL (ref 0.7–3.1)
Lymphs: 22 %
MCH: 31.4 pg (ref 26.6–33.0)
MCHC: 33.1 g/dL (ref 31.5–35.7)
MCV: 95 fL (ref 79–97)
Monocytes Absolute: 0.5 10*3/uL (ref 0.1–0.9)
Monocytes: 7 %
Neutrophils Absolute: 5.7 10*3/uL (ref 1.4–7.0)
Neutrophils: 69 %
Platelets: 286 10*3/uL (ref 150–450)
RBC: 3.22 x10E6/uL — ABNORMAL LOW (ref 3.77–5.28)
RDW: 12.5 % (ref 11.7–15.4)
WBC: 8.2 10*3/uL (ref 3.4–10.8)

## 2023-08-22 IMAGING — MG MM DIGITAL SCREENING BILAT W/ TOMO AND CAD
8 series · 8 of 24 positions shown · non-contrast
Comparison: Previous exam 01/06/2014 [REDACTED] in [facility]

CLINICAL DATA: Screening.

EXAM:
DIGITAL SCREENING BILATERAL MAMMOGRAM WITH TOMOSYNTHESIS AND CAD
TECHNIQUE: Bilateral screening digital craniocaudal and mediolateral oblique
mammograms were obtained. Bilateral screening digital breast
tomosynthesis was performed. The images were evaluated with
computer-aided detection.

[L MLO synth-2D]
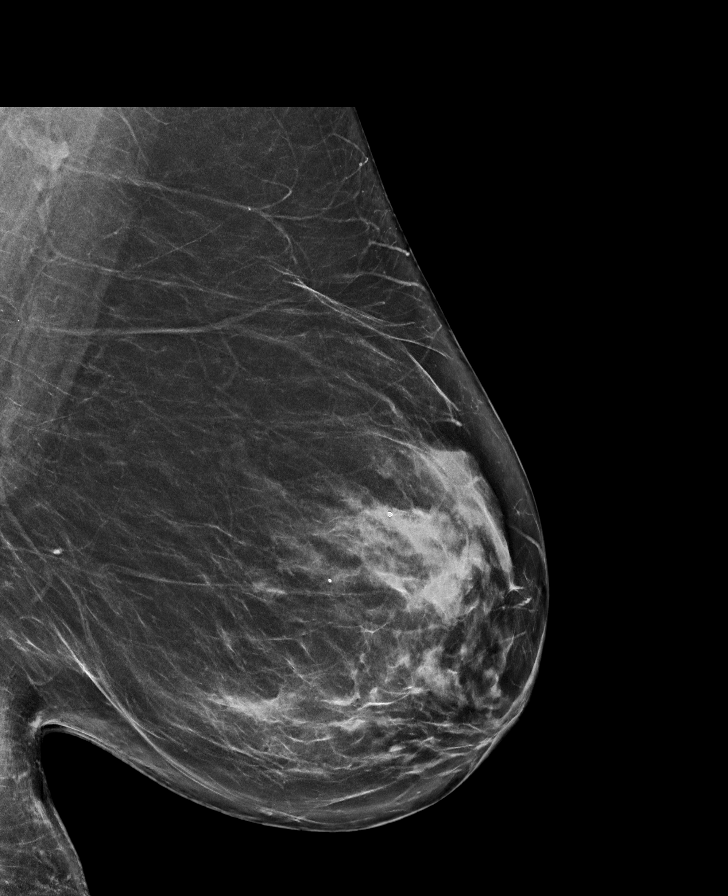

[R CC synth-2D]
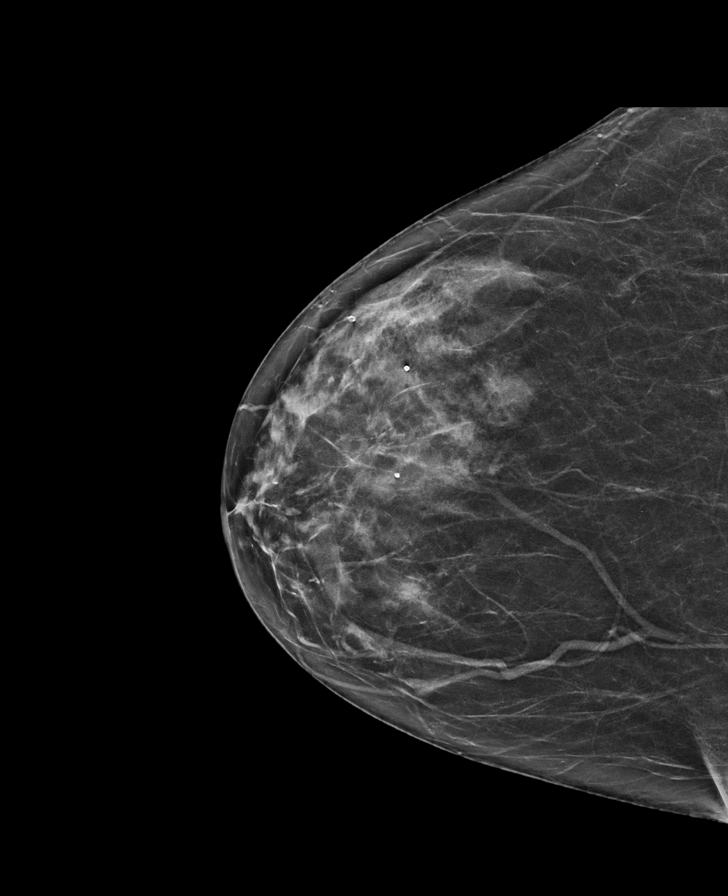

[L CC synth-2D]
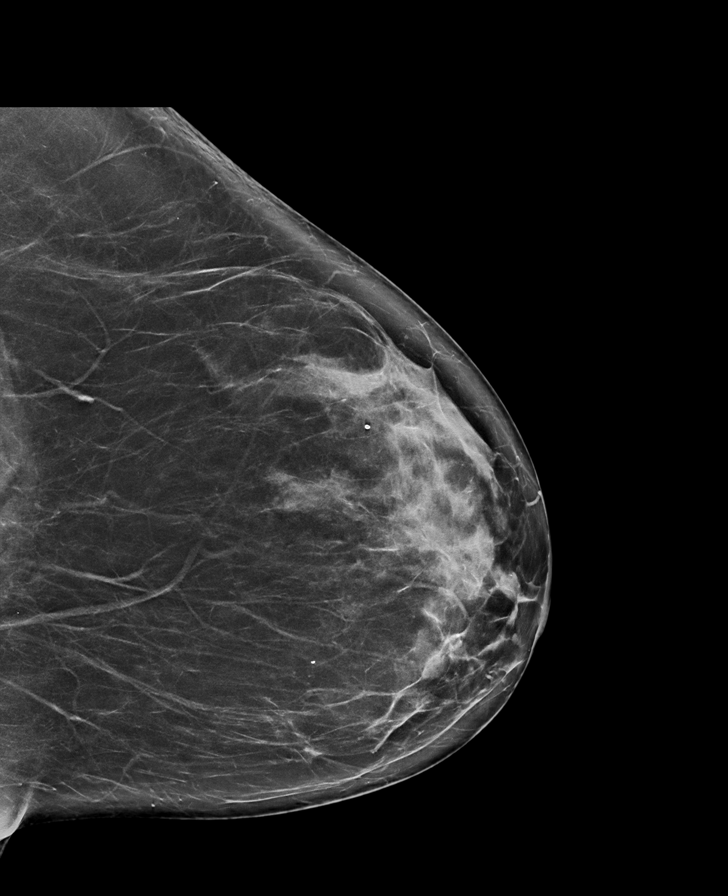

[R MLO synth-2D]
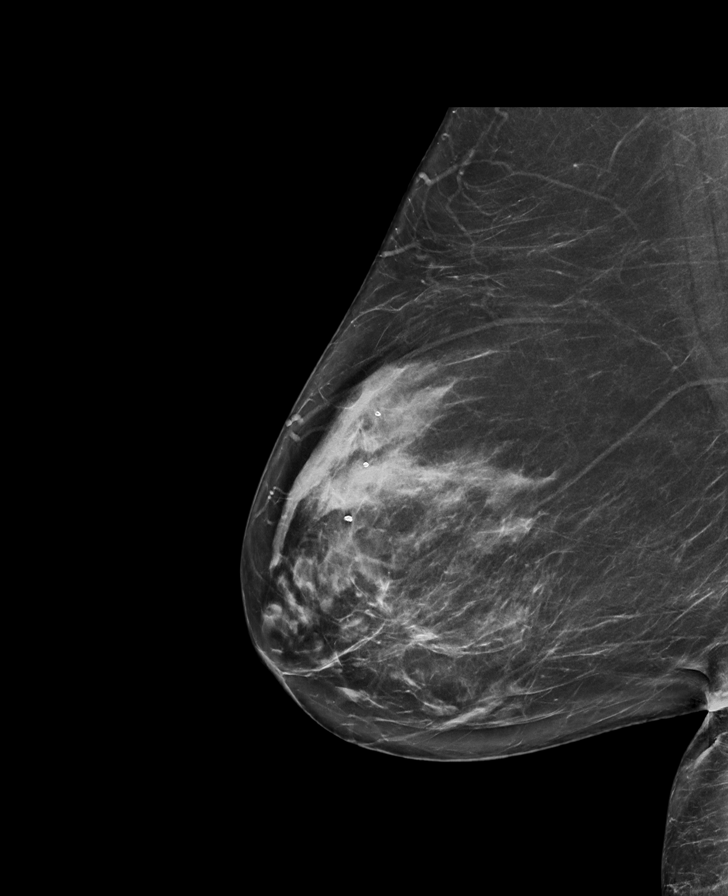

[L CC tomo · tomo slice 41/82.0]
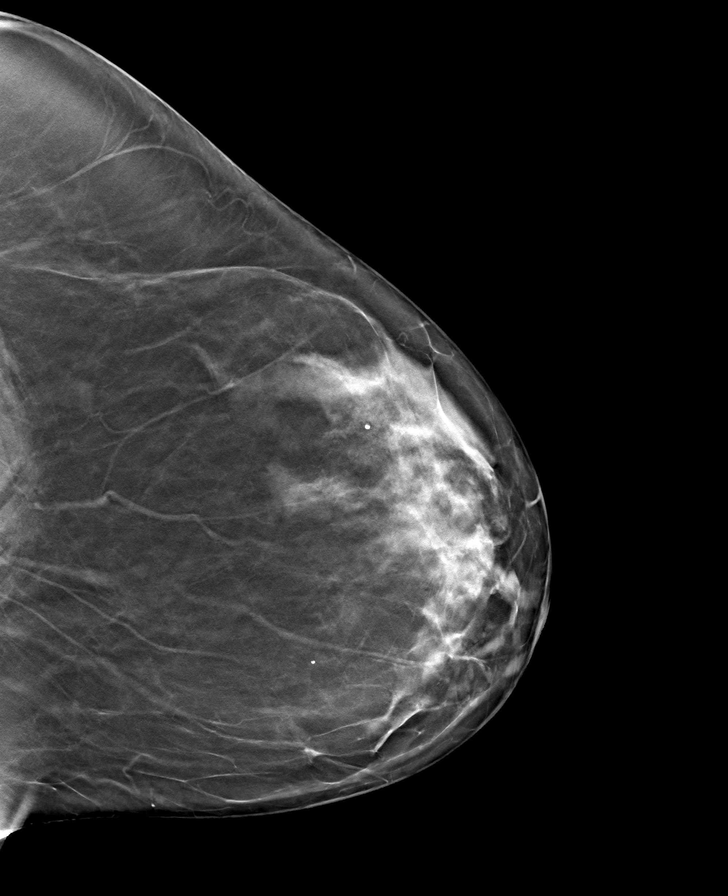

[R CC tomo · tomo slice 34/67.0]
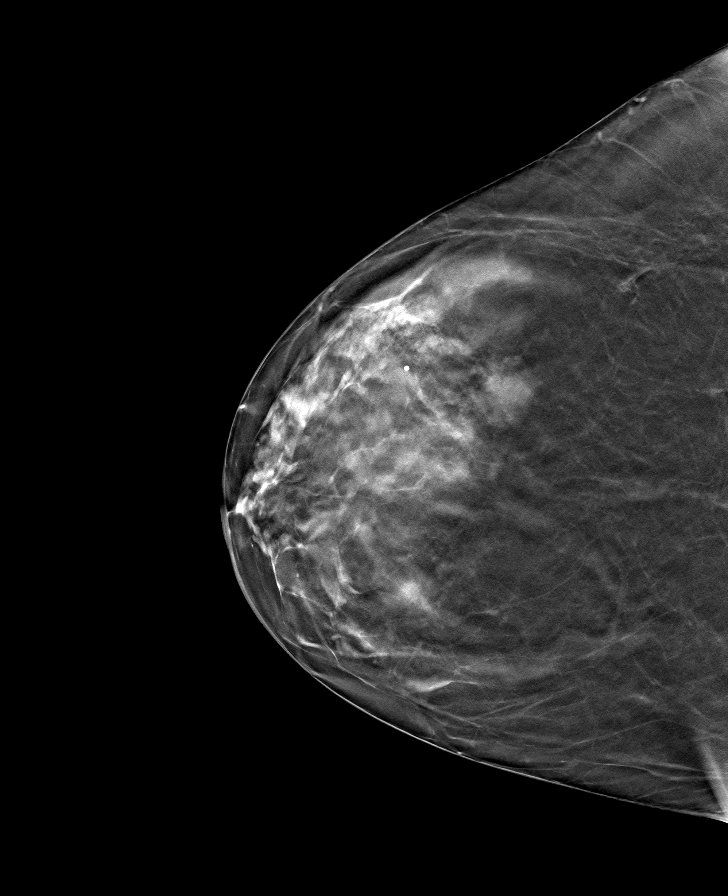

[L MLO tomo · tomo slice 43/84.0]
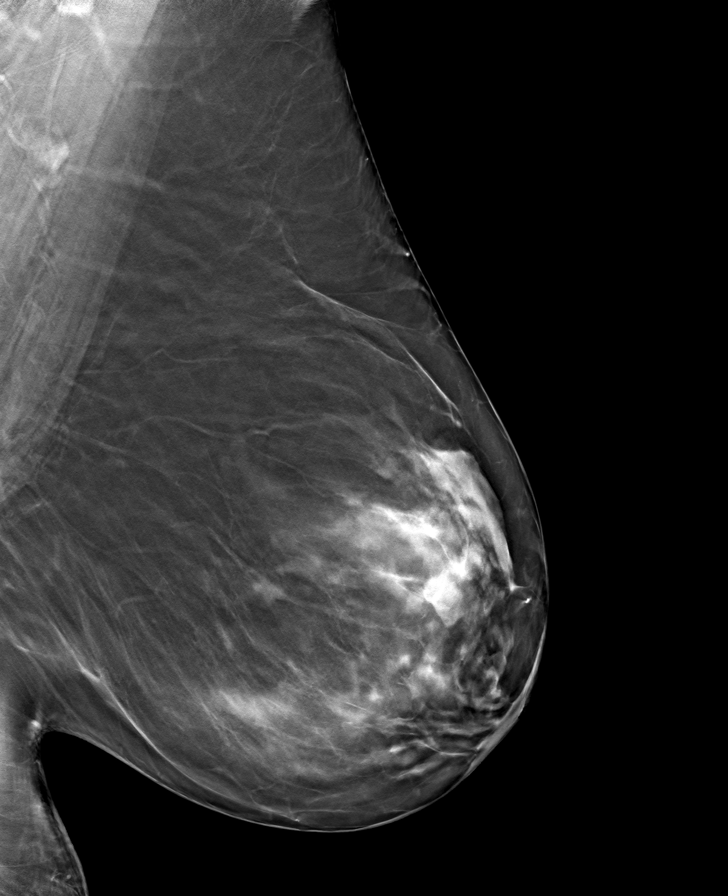

[R MLO tomo · tomo slice 40/79.0]
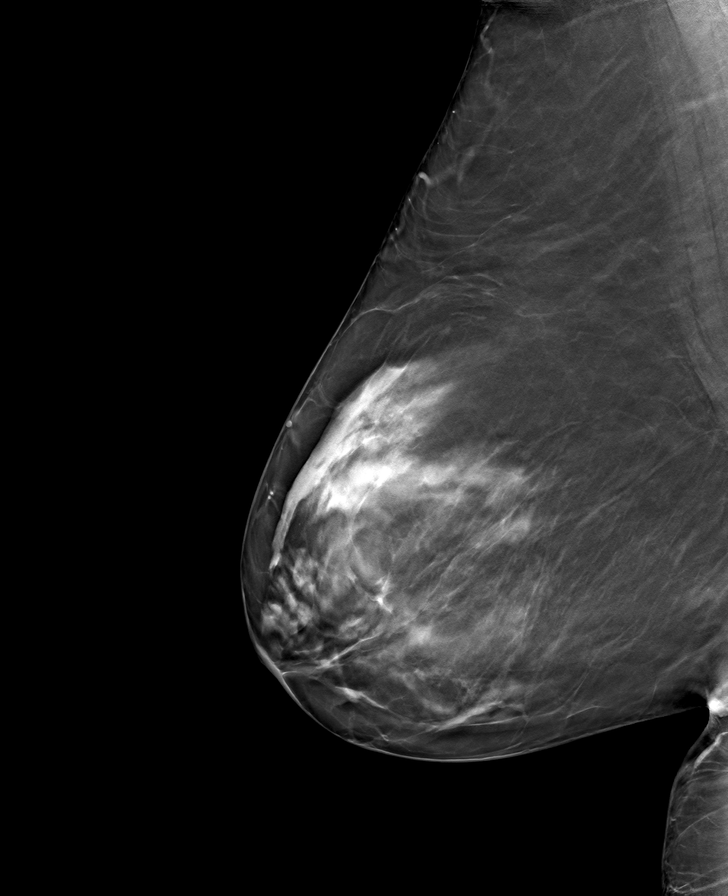

[8 of 24 positions shown; findings below may reference images not displayed]

Awaiting the prior outside mammograms accounts for
the delay in this report.

ACR Breast Density Category c: The breast tissue is heterogeneously
dense, which may obscure small masses.
FINDINGS: There are no findings suspicious for malignancy.
IMPRESSION: No mammographic evidence of malignancy. A result letter of this
screening mammogram will be mailed directly to the patient.

RECOMMENDATION:
Screening mammogram in one year. (Code:[id number])

BI-RADS CATEGORY  1: Negative.

## 2023-08-26 NOTE — Addendum Note (Signed)
Addended by: Ronnald Nian on: 08/26/2023 10:20 AM   Modules accepted: Orders

## 2023-08-26 NOTE — Addendum Note (Signed)
Addended by: Ronnald Nian on: 08/26/2023 10:19 AM   Modules accepted: Orders

## 2023-10-07 ENCOUNTER — Other Ambulatory Visit: Payer: Self-pay | Admitting: Family Medicine

## 2023-10-07 DIAGNOSIS — I10 Essential (primary) hypertension: Secondary | ICD-10-CM

## 2023-10-08 ENCOUNTER — Ambulatory Visit: Payer: 59

## 2023-10-08 NOTE — Telephone Encounter (Signed)
Pt needs appt. Called pt, no answer, left voicemail.

## 2023-10-13 NOTE — Telephone Encounter (Signed)
Notified pt that she is due for an appt. States she will call back to schedule.

## 2023-10-16 ENCOUNTER — Telehealth: Payer: Self-pay | Admitting: Family Medicine

## 2023-10-16 DIAGNOSIS — I10 Essential (primary) hypertension: Secondary | ICD-10-CM

## 2023-10-16 MED ORDER — LOSARTAN POTASSIUM-HCTZ 100-12.5 MG PO TABS: 1.0000 | ORAL_TABLET | Freq: Every day | ORAL | 0 refills | Status: DC

## 2023-10-16 NOTE — Telephone Encounter (Signed)
Pt needs refill losartan  CPE already scheduled   [facility]

## 2023-10-22 ENCOUNTER — Other Ambulatory Visit: Payer: 59

## 2023-10-22 DIAGNOSIS — N289 Disorder of kidney and ureter, unspecified: Secondary | ICD-10-CM

## 2023-10-22 LAB — CBC WITH DIFFERENTIAL/PLATELET
Basophils Absolute: 0.1 10*3/uL (ref 0.0–0.2)
Basos: 1 %
EOS (ABSOLUTE): 0.1 10*3/uL (ref 0.0–0.4)
Eos: 2 %
Hematocrit: 32.1 % — ABNORMAL LOW (ref 34.0–46.6)
Hemoglobin: 10.9 g/dL — ABNORMAL LOW (ref 11.1–15.9)
Immature Grans (Abs): 0 10*3/uL (ref 0.0–0.1)
Immature Granulocytes: 0 %
Lymphocytes Absolute: 2.4 10*3/uL (ref 0.7–3.1)
Lymphs: 29 %
MCH: 32.3 pg (ref 26.6–33.0)
MCHC: 34 g/dL (ref 31.5–35.7)
MCV: 95 fL (ref 79–97)
Monocytes Absolute: 0.6 10*3/uL (ref 0.1–0.9)
Monocytes: 7 %
Neutrophils Absolute: 5.1 10*3/uL (ref 1.4–7.0)
Neutrophils: 61 %
Platelets: 276 10*3/uL (ref 150–450)
RBC: 3.37 x10E6/uL — ABNORMAL LOW (ref 3.77–5.28)
RDW: 12.6 % (ref 11.7–15.4)
WBC: 8.3 10*3/uL (ref 3.4–10.8)

## 2023-11-11 ENCOUNTER — Encounter: Payer: Self-pay | Admitting: Internal Medicine

## 2023-11-14 ENCOUNTER — Encounter: Payer: Self-pay | Admitting: Family Medicine

## 2023-11-17 ENCOUNTER — Ambulatory Visit: Payer: 59 | Admitting: Family Medicine

## 2023-11-17 ENCOUNTER — Encounter: Payer: Self-pay | Admitting: Family Medicine

## 2023-11-17 VITALS — BP 128/82 | HR 64 | Ht 63.0 in | Wt 187.6 lb

## 2023-11-17 DIAGNOSIS — F411 Generalized anxiety disorder: Secondary | ICD-10-CM | POA: Diagnosis not present

## 2023-11-17 DIAGNOSIS — Z23 Encounter for immunization: Secondary | ICD-10-CM

## 2023-11-17 DIAGNOSIS — E66811 Obesity, class 1: Secondary | ICD-10-CM

## 2023-11-17 DIAGNOSIS — Z0000 Encounter for general adult medical examination without abnormal findings: Secondary | ICD-10-CM | POA: Diagnosis not present

## 2023-11-17 DIAGNOSIS — Z8572 Personal history of non-Hodgkin lymphomas: Secondary | ICD-10-CM

## 2023-11-17 DIAGNOSIS — E782 Mixed hyperlipidemia: Secondary | ICD-10-CM

## 2023-11-17 DIAGNOSIS — D508 Other iron deficiency anemias: Secondary | ICD-10-CM

## 2023-11-17 DIAGNOSIS — I10 Essential (primary) hypertension: Secondary | ICD-10-CM

## 2023-11-17 DIAGNOSIS — Z949 Transplanted organ and tissue status, unspecified: Secondary | ICD-10-CM

## 2023-11-17 DIAGNOSIS — Z8619 Personal history of other infectious and parasitic diseases: Secondary | ICD-10-CM

## 2023-11-17 DIAGNOSIS — N8189 Other female genital prolapse: Secondary | ICD-10-CM

## 2023-11-17 MED ORDER — LOSARTAN POTASSIUM-HCTZ 100-12.5 MG PO TABS: 1.0000 | ORAL_TABLET | Freq: Every day | ORAL | 0 refills | Status: DC

## 2023-11-17 MED ORDER — TIRZEPATIDE-WEIGHT MANAGEMENT 2.5 MG/0.5ML ~~LOC~~ SOLN: 2.5000 mg | SUBCUTANEOUS | 0 refills | Status: DC

## 2023-11-17 MED ORDER — CLONAZEPAM 0.5 MG PO TABS: 0.5000 mg | ORAL_TABLET | Freq: Two times a day (BID) | ORAL | 0 refills | Status: DC | PRN

## 2023-11-17 MED ORDER — ROSUVASTATIN CALCIUM 40 MG PO TABS: 40.0000 mg | ORAL_TABLET | Freq: Every day | ORAL | 3 refills | Status: DC

## 2023-11-17 NOTE — Progress Notes (Signed)
 Complete physical exam  Patient: Cheryl Knox   DOB: 03/20/1961   63 y.o. Female  MRN: [id number]  Subjective:    Chief Complaint  Patient presents with   Annual Exam    CPE, no other issues, wants to discuss weight loss medications, has PA number for insurance [phone]    Cheryl Knox is a 63 y.o. female who presents today for a complete physical exam. She reports consuming a general diet. Home exercise routine includes walk. She generally feels fairly well. She reports sleeping fairly well. She would like to try a weight loss medication and apparently her insurance will cover this.  She does take Klonopin on an as-needed basis.  She also has a previous history of lymphoma and subsequent transplant.  She had subsequent immunizations given to her after the bone marrow transplant.  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  She continues on Crestor and is having no difficulty with that.  She plans to see a gynecologist and does have a pessary placed.  Review of her blood work does show slightly low hemoglobin. Most recent fall risk assessment:    11/17/2023   12:24 PM  Fall Risk   Falls in the past year? 0  Number falls in past yr: 0  Injury with Fall? 0  Risk for fall due to : No Fall Risks  Follow up Falls evaluation completed     Most recent depression screenings:    11/17/2023   12:25 PM 01/01/2022    2:11 PM  PHQ 2/9 Scores  PHQ - 2 Score 0 0        Patient Care Team: Ronnald Nian, MD as PCP - General (Family Medicine)   Outpatient Medications Prior to Visit  Medication Sig Note   magnesium gluconate (MAGONATE) 500 MG tablet Take 500 mg by mouth 2 (two) times daily.    Multiple Vitamin (MULTIVITAMIN PO) Take by mouth. With iron    [DISCONTINUED] losartan-hydrochlorothiazide (HYZAAR) 100-12.5 MG tablet Take 1 tablet by mouth daily.    [DISCONTINUED] rosuvastatin (CRESTOR) 40 MG tablet Take 1  tablet (40 mg total) by mouth daily.    hyoscyamine (LEVBID) 0.375 MG 12 hr tablet Take 1 tablet (0.375 mg total) by mouth 2 (two) times daily. (Patient not taking: Reported on 11/17/2023)    [DISCONTINUED] clonazePAM (KLONOPIN) 0.5 MG tablet Take 1 tablet (0.5 mg total) by mouth 2 (two) times daily as needed for anxiety. (Patient not taking: Reported on 11/17/2023) 11/17/2023: prn   No facility-administered medications prior to visit.    Review of Systems  All other systems reviewed and are negative.         Objective:     BP 128/82   Pulse 64   Ht 5\' 3"  (1.6 m)   Wt 187 lb 9.6 oz (85.1 kg)   BMI 33.23 kg/m    Physical Exam  Alert and in no distress. Tympanic membranes and canals are normal. Pharyngeal area is normal. Neck is supple without adenopathy or thyromegaly. Cardiac exam shows a regular sinus rhythm without murmurs or gallops. Lungs are clear to auscultation.      Assessment & Plan:    Routine Health Maintenance and Physical Exam  Immunization History  Administered Date(s) Administered   DTaP / Hep B / IPV 04/10/2015, 10/03/2015, 04/02/2016   HIB (PRP-T) 04/10/2015, 10/03/2015, 04/02/2016   Influenza Split 07/05/2014   Influenza, Seasonal, Injecte, Preservative Fre  07/10/2023   Influenza,inj,Quad PF,6+ Mos 05/26/2015, 06/14/2016, 08/06/2017, 06/27/2022   Influenza-Unspecified 07/05/2014, 05/26/2015   Moderna Sars-Covid-2 Vaccination 11/11/2019, 12/14/2019   PFIZER Comirnaty(Gray Top)Covid-19 Tri-Sucrose Vaccine 06/27/2022   Pfizer(Comirnaty)Fall Seasonal Vaccine 12 years and older 07/10/2023   Pneumococcal Conjugate-13 04/10/2015, 10/03/2015, 04/02/2016   Tdap 04/21/2014, 11/17/2023   Zoster Recombinant(Shingrix) 09/26/2017    Health Maintenance  Topic Date Due   Colonoscopy  Never done   Pneumococcal Vaccine 90-32 Years old (2 of 2 - PPSV23 or PCV20) 05/28/2016   Zoster Vaccines- Shingrix (2 of 2) 11/21/2017   COVID-19 Vaccine (5 - 2024-25 season)  09/04/2023   MAMMOGRAM  02/27/2024   Cervical Cancer Screening (HPV/Pap Cotest)  01/14/2025   DTaP/Tdap/Td (6 - Td or Tdap) 11/16/2033   INFLUENZA VACCINE  Completed   Hepatitis C Screening  Completed   HIV Screening  Completed   HPV VACCINES  Aged Out     Problem List Items Addressed This Visit     Anxiety state   Relevant Medications   clonazePAM (KLONOPIN) 0.5 MG tablet   History of lymphoma   History of organ or tissue transplant   History of shingles   HTN (hypertension)   Relevant Medications   rosuvastatin (CRESTOR) 40 MG tablet   losartan-hydrochlorothiazide (HYZAAR) 100-12.5 MG tablet   Mixed hyperlipidemia   Relevant Medications   rosuvastatin (CRESTOR) 40 MG tablet   losartan-hydrochlorothiazide (HYZAAR) 100-12.5 MG tablet   Other female genital prolapse   Other Visit Diagnoses       Routine general medical examination at a health care facility    -  Primary     Need for Tdap vaccination       Relevant Orders   Tdap vaccine greater than or equal to 7yo IM (Completed)     Obesity (BMI 30.0-34.9)         Iron deficiency anemia secondary to inadequate dietary iron intake         Recommend a multivitamin with iron.  I discussed the use of Zepbound with her in terms of potential side effects and benefits.  She is return here in roughly 2 months for recheck on this and CBC. Return in about 2 months (around 01/17/2024).     Sharlot Gowda, MD

## 2023-11-20 ENCOUNTER — Telehealth: Payer: Self-pay

## 2023-11-20 ENCOUNTER — Other Ambulatory Visit (HOSPITAL_COMMUNITY): Payer: Self-pay

## 2023-11-20 NOTE — Telephone Encounter (Signed)
 Pharmacy Patient Advocate Encounter   Received notification from Physician's Office that prior authorization for Zepbound 2.5MG /0.5ML pen-injectors  is required/requested.   Insurance verification completed.   The patient is insured through [facility] .   Per test claim: PA required; PA submitted to above mentioned insurance via CoverMyMeds Key/confirmation #/EOC (Key: [facility])     Status is pending

## 2023-11-20 NOTE — Telephone Encounter (Signed)
 Pharmacy Patient Advocate Encounter  Received notification from [facility] that Prior Authorization for Zepbound 2.5MG /0.5ML pen-injectors  has been APPROVED from 3.6.25 to 11.1.25. Ran test claim, Copay is $25.00. This test claim was processed through [facility]- copay amounts may vary at other pharmacies due to pharmacy/plan contracts, or as the patient moves through the different stages of their insurance plan.

## 2023-11-20 NOTE — Telephone Encounter (Signed)
 No mam, I didn't but I just got an approval for you.  Effective 3.6.25 to 07/17/24. Cost is around/about 25.00$

## 2023-11-20 NOTE — Telephone Encounter (Signed)
 Have you all received a PA for zepbound on this pt? If so, can you provide an update? Thank you

## 2023-11-24 ENCOUNTER — Other Ambulatory Visit (HOSPITAL_BASED_OUTPATIENT_CLINIC_OR_DEPARTMENT_OTHER): Payer: Self-pay

## 2023-11-24 ENCOUNTER — Telehealth: Payer: Self-pay | Admitting: Family Medicine

## 2023-11-24 ENCOUNTER — Other Ambulatory Visit (HOSPITAL_COMMUNITY): Payer: Self-pay

## 2023-11-24 MED ORDER — TIRZEPATIDE-WEIGHT MANAGEMENT 2.5 MG/0.5ML ~~LOC~~ SOAJ
2.5000 mg | SUBCUTANEOUS | 1 refills | Status: DC
Start: 2023-11-24 — End: 2024-01-20
  Filled 2023-11-24: qty 2, 28d supply, fill #0
  Filled 2023-12-17: qty 2, 28d supply, fill #1

## 2023-11-24 NOTE — Telephone Encounter (Signed)
 Zepbound was sent in for vials   Needs for Pens not vial   send to [facility], [facility] does not have

## 2023-11-24 NOTE — Telephone Encounter (Signed)
Sent in pens

## 2023-11-25 ENCOUNTER — Other Ambulatory Visit (HOSPITAL_COMMUNITY): Payer: Self-pay

## 2023-12-12 ENCOUNTER — Other Ambulatory Visit (HOSPITAL_COMMUNITY): Payer: Self-pay

## 2024-01-20 ENCOUNTER — Other Ambulatory Visit (HOSPITAL_COMMUNITY): Payer: Self-pay

## 2024-01-20 ENCOUNTER — Ambulatory Visit: Admitting: Family Medicine

## 2024-01-20 ENCOUNTER — Other Ambulatory Visit: Payer: Self-pay

## 2024-01-20 VITALS — BP 120/80 | HR 109 | Wt 176.2 lb

## 2024-01-20 DIAGNOSIS — R7303 Prediabetes: Secondary | ICD-10-CM

## 2024-01-20 MED ORDER — TIRZEPATIDE-WEIGHT MANAGEMENT 5 MG/0.5ML ~~LOC~~ SOAJ
5.0000 mg | SUBCUTANEOUS | 1 refills | Status: DC
  Filled 2024-01-20: qty 2, 28d supply, fill #0
  Filled 2024-02-12: qty 2, 28d supply, fill #1

## 2024-01-20 NOTE — Patient Instructions (Signed)
 Leave a message on MyChart in a month to let me know how you are doing and if everything is fine I will bump you up the next higher dose then see you okay

## 2024-01-20 NOTE — Progress Notes (Signed)
   Subjective:    Patient ID: Cheryl Knox, female    DOB: [birth year], 63 y.o.   MRN: [id number]  HPI She is here for recheck on her weight loss.  She is doing well on the medication and having no nausea, vomiting, abdominal pain or other symptoms.  She does note a decrease in food noise and is also cut down on her alcohol consumption.   Review of Systems     Objective:    Physical Exam Alert and in no distress otherwise not examined       Assessment & Plan:  Prediabetes - Plan: tirzepatide  5 MG/0.5ML injection vial I will increase her Zepbound  to the 5 and then she is to call me in a month and I will go the next highest dose.  Recheck 1 month after that and do some blood work.

## 2024-03-14 ENCOUNTER — Other Ambulatory Visit: Payer: Self-pay | Admitting: Family Medicine

## 2024-03-14 DIAGNOSIS — E66811 Obesity, class 1: Secondary | ICD-10-CM

## 2024-03-14 MED ORDER — ZEPBOUND 7.5 MG/0.5ML ~~LOC~~ SOLN: 7.5000 mg | SUBCUTANEOUS | 1 refills | Status: DC

## 2024-03-15 ENCOUNTER — Other Ambulatory Visit (HOSPITAL_BASED_OUTPATIENT_CLINIC_OR_DEPARTMENT_OTHER): Payer: Self-pay

## 2024-03-15 ENCOUNTER — Other Ambulatory Visit (HOSPITAL_COMMUNITY): Payer: Self-pay

## 2024-03-15 ENCOUNTER — Encounter (HOSPITAL_BASED_OUTPATIENT_CLINIC_OR_DEPARTMENT_OTHER): Payer: Self-pay

## 2024-03-15 ENCOUNTER — Other Ambulatory Visit: Payer: Self-pay | Admitting: Family Medicine

## 2024-03-15 DIAGNOSIS — E66811 Obesity, class 1: Secondary | ICD-10-CM

## 2024-03-15 MED ORDER — ZEPBOUND 7.5 MG/0.5ML ~~LOC~~ SOAJ
7.5000 mg | SUBCUTANEOUS | 1 refills | Status: DC
Start: 2024-03-15 — End: 2024-04-21
  Filled 2024-03-15: qty 2, 28d supply, fill #0
  Filled 2024-04-09: qty 2, 28d supply, fill #1

## 2024-03-24 ENCOUNTER — Ambulatory Visit: Admitting: Family Medicine

## 2024-04-14 ENCOUNTER — Other Ambulatory Visit: Payer: Self-pay | Admitting: Family Medicine

## 2024-04-14 DIAGNOSIS — I10 Essential (primary) hypertension: Secondary | ICD-10-CM

## 2024-04-20 DIAGNOSIS — E66811 Obesity, class 1: Secondary | ICD-10-CM | POA: Insufficient documentation

## 2024-04-21 ENCOUNTER — Encounter: Payer: Self-pay | Admitting: Family Medicine

## 2024-04-21 ENCOUNTER — Other Ambulatory Visit (HOSPITAL_COMMUNITY): Payer: Self-pay

## 2024-04-21 ENCOUNTER — Ambulatory Visit: Admitting: Family Medicine

## 2024-04-21 VITALS — BP 138/80 | HR 106 | Ht 63.0 in | Wt 163.4 lb

## 2024-04-21 DIAGNOSIS — E66811 Obesity, class 1: Secondary | ICD-10-CM

## 2024-04-21 DIAGNOSIS — I10 Essential (primary) hypertension: Secondary | ICD-10-CM

## 2024-04-21 MED ORDER — ZEPBOUND 10 MG/0.5ML ~~LOC~~ SOAJ
10.0000 mg | SUBCUTANEOUS | 1 refills | Status: DC
  Filled 2024-04-21: qty 2, 28d supply, fill #0
  Filled 2024-05-31: qty 2, 28d supply, fill #1

## 2024-04-21 NOTE — Progress Notes (Signed)
   Subjective:    Patient ID: Cheryl Knox, female    DOB: [birth year], 63 y.o.   MRN: [id number]  Discussed the use of AI scribe software for clinical note transcription with the patient, who gave verbal consent to proceed.  History of Present Illness   Cheryl Knox is a 63 year old female who presents for weight management and knee pain.  She is on semaglutide 7.5 mg, which she occasionally misses. Missing a dose increases her hunger, leading to increased food intake on those days. Despite this, she has successfully reduced her weight from 202 pounds to 163 pounds. The medication helps reduce 'food noise', making her less inclined to consume alcohol and large portions of food.  She sustained an injury to her right knee two weeks ago when she ran into a door seal to avoid hitting her dog. She describes initial pain and reported feeling something moving in the knee. Following the injury, she experienced swelling and pain that radiated to her calf. A subsequent incident where a door shut on her flip flop caused further strain to the knee. She is concerned about the possibility of a blood clot due to her history of acute lymphoblastic lymphoma.  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 She is vigilant about her health due to her medical history, often measuring her calves to monitor for swelling.           Review of Systems     Objective:    Physical Exam Physical Exam   MEASUREMENTS: Weight- 163. EXTREMITIES: Right knee ligaments intact. No effusion noted, normal motion. Ecchymosis distally noted            Assessment & Plan:  Assessment and Plan    Obesity Obesity management with semaglutide (Ozempic) is ongoing, resulting in significant weight loss from 270 pounds to 163 pounds. She reported increased hunger on missed dose days, but overall reduced  food cravings and intake. - Increase semaglutide (Ozempic) dose to 10 mg next week. - Use MyChart to update provider in a month about progress. - Schedule follow-up appointment in a couple of months to monitor weight and treatment efficacy.  Right knee and lower leg contusion Sustained a contusion to the right knee. Initial pain and swelling have subsided. Examination showed no swelling and intact ligaments, consistent with normal healing of a contusion without complications. - Reassured about normal healing process and addressed any concerns.  General Health Maintenance Advised to maintain hydration due to recent fatigue and reduced water intake. Encouraged to continue healthy eating habits facilitated by current medication. - Encourage adequate hydration. - Continue healthy eating habits.

## 2024-06-24 ENCOUNTER — Other Ambulatory Visit: Payer: Self-pay

## 2024-06-24 ENCOUNTER — Other Ambulatory Visit (HOSPITAL_COMMUNITY): Payer: Self-pay

## 2024-06-24 ENCOUNTER — Encounter: Payer: Self-pay | Admitting: Family Medicine

## 2024-06-24 ENCOUNTER — Ambulatory Visit: Admitting: Family Medicine

## 2024-06-24 VITALS — BP 128/72 | HR 92 | Ht 63.0 in | Wt 157.8 lb

## 2024-06-24 DIAGNOSIS — F411 Generalized anxiety disorder: Secondary | ICD-10-CM

## 2024-06-24 DIAGNOSIS — E66811 Obesity, class 1: Secondary | ICD-10-CM | POA: Diagnosis not present

## 2024-06-24 DIAGNOSIS — I10 Essential (primary) hypertension: Secondary | ICD-10-CM

## 2024-06-24 DIAGNOSIS — E782 Mixed hyperlipidemia: Secondary | ICD-10-CM

## 2024-06-24 DIAGNOSIS — Z23 Encounter for immunization: Secondary | ICD-10-CM

## 2024-06-24 MED ORDER — ROSUVASTATIN CALCIUM 40 MG PO TABS: 40.0000 mg | ORAL_TABLET | Freq: Every day | ORAL | 3 refills | Status: AC

## 2024-06-24 MED ORDER — LOSARTAN POTASSIUM-HCTZ 100-12.5 MG PO TABS: 1.0000 | ORAL_TABLET | Freq: Every day | ORAL | 3 refills | Status: AC

## 2024-06-24 MED ORDER — ZEPBOUND 12.5 MG/0.5ML ~~LOC~~ SOAJ: 12.5000 mg | SUBCUTANEOUS | 1 refills | Status: DC

## 2024-06-24 MED ORDER — ZEPBOUND 12.5 MG/0.5ML ~~LOC~~ SOAJ
12.5000 mg | SUBCUTANEOUS | 1 refills | Status: DC
  Filled 2024-06-24: qty 2, 28d supply, fill #0
  Filled 2024-07-29: qty 2, 28d supply, fill #1

## 2024-06-24 NOTE — Progress Notes (Signed)
   Subjective:    Patient ID: Cheryl Knox, female    DOB: 1960-10-30, 63 y.o.   MRN: [id number]  Discussed the use of AI scribe software for clinical note transcription with the patient, who gave verbal consent to proceed.  History of Present Illness   Cheryl Knox is a 63 year old female who presents with weight management and gastrointestinal issues.  She has been experiencing gastrointestinal discomfort, primarily constipation, after discontinuing Miralax for a few days, leading to feelings of distention and incomplete bowel movements. Her bowel movements have been small and infrequent, and she has not had a substantial bowel movement since stopping Miralax. She resumed Miralax and is awaiting its effect. Additionally, she has been taking large amounts of amoxicillin  for recent dental work, which she believes has contributed to her gastrointestinal discomfort, including diarrhea.  Since February, she has been on a weight loss journey using Mounjaro , resulting in a significant weight reduction from 202 pounds in July 2023 to 152.3 pounds recently. She reports increased energy, improved sleep, and a decrease in 'food noise,' stating that she no longer constantly thinks about food. Her mental state has improved, partly due to changes in her work environment, including the departure of a toxic coworker.  Her current medications include Crestor  and losartan  for blood pressure management. She mentions needing to renew her blood pressure medication and rosuvastatin . She also has a prescription for clonazepam  but has not needed to use it recently, except for an upcoming flight to [place].  She is planning to travel to [place] and is interested in receiving COVID and flu vaccinations. She has previously received the shingles vaccine at [facility] and is aware of the need for a pneumococcal vaccine booster, as her last one was in 2017.  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           Review of Systems     Objective:    Physical Exam Alert and in no distress otherwise not examined             Assessment & Plan:     Constipation Chronic constipation worsened by stopping Miralax and antibiotic use. Miralax effective when used regularly. - Continue Miralax daily. - Advise prompt response to defecation urge.  Obesity, class 1 Significant weight loss with Mounjaro  since February. Improved energy, sleep, and reduced food noise. - Increase Mounjaro  to 12.5 mg. - Encourage continued healthy lifestyle changes.  Essential hypertension Blood pressure managed with losartan  and rosuvastatin . Weight loss may require medication adjustment. - Renew losartan  and rosuvastatin . - Reassess blood pressure medication dosage in future visits.  General Health Maintenance Due for flu and COVID vaccinations. Pneumococcal booster recommended. Shingles vaccination record needs verification. - Administer flu and COVID vaccinations today. - Verify shingles vaccination record and update if necessary. - Plan pneumococcal vaccination at a later date.     Recheck in 2 months.

## 2024-08-23 ENCOUNTER — Encounter: Payer: Self-pay | Admitting: Family Medicine

## 2024-08-24 ENCOUNTER — Ambulatory Visit: Payer: Self-pay | Admitting: Family Medicine

## 2024-08-24 DIAGNOSIS — Z23 Encounter for immunization: Secondary | ICD-10-CM

## 2024-08-31 ENCOUNTER — Other Ambulatory Visit (HOSPITAL_COMMUNITY): Payer: Self-pay

## 2024-08-31 ENCOUNTER — Other Ambulatory Visit: Payer: Self-pay | Admitting: Family Medicine

## 2024-08-31 DIAGNOSIS — E66811 Obesity, class 1: Secondary | ICD-10-CM

## 2024-08-31 MED ORDER — ZEPBOUND 15 MG/0.5ML ~~LOC~~ SOAJ
15.0000 mg | SUBCUTANEOUS | 1 refills | Status: DC
  Filled 2024-08-31: qty 2, 28d supply, fill #0
  Filled 2024-09-24 – 2024-10-07 (×2): qty 2, 28d supply, fill #1

## 2024-09-25 ENCOUNTER — Other Ambulatory Visit (HOSPITAL_COMMUNITY): Payer: Self-pay

## 2024-09-30 ENCOUNTER — Other Ambulatory Visit (HOSPITAL_COMMUNITY): Payer: Self-pay

## 2024-10-04 ENCOUNTER — Telehealth (HOSPITAL_COMMUNITY): Payer: Self-pay

## 2024-10-04 ENCOUNTER — Telehealth (HOSPITAL_COMMUNITY): Payer: Self-pay | Admitting: Pharmacy Technician

## 2024-10-04 ENCOUNTER — Other Ambulatory Visit (HOSPITAL_COMMUNITY): Payer: Self-pay

## 2024-10-04 NOTE — Telephone Encounter (Signed)
 Pharmacy Patient Advocate Encounter   Received notification from Pt Calls Messages that prior authorization for Zepbound  15MG /0.5ML pen-injectors  is required/requested.   Insurance verification completed.   The patient is insured through [facility].   Per test claim: PA required; PA submitted to above mentioned insurance via Latent Key/confirmation #/EOC [id number] Status is pending

## 2024-10-04 NOTE — Telephone Encounter (Signed)
 PA request has been Received. New Encounter has been or will be created for follow up. For additional info see Pharmacy Prior Auth telephone encounter from 10/04/24.

## 2024-10-05 ENCOUNTER — Ambulatory Visit: Admitting: Family Medicine

## 2024-10-05 ENCOUNTER — Encounter: Payer: Self-pay | Admitting: Family Medicine

## 2024-10-05 ENCOUNTER — Other Ambulatory Visit (HOSPITAL_COMMUNITY): Payer: Self-pay

## 2024-10-05 VITALS — BP 136/78 | HR 65 | Ht 63.0 in | Wt 152.0 lb

## 2024-10-05 DIAGNOSIS — E782 Mixed hyperlipidemia: Secondary | ICD-10-CM

## 2024-10-05 DIAGNOSIS — Z949 Transplanted organ and tissue status, unspecified: Secondary | ICD-10-CM

## 2024-10-05 DIAGNOSIS — R21 Rash and other nonspecific skin eruption: Secondary | ICD-10-CM

## 2024-10-05 DIAGNOSIS — E66811 Obesity, class 1: Secondary | ICD-10-CM | POA: Diagnosis not present

## 2024-10-05 DIAGNOSIS — F411 Generalized anxiety disorder: Secondary | ICD-10-CM

## 2024-10-05 DIAGNOSIS — I10 Essential (primary) hypertension: Secondary | ICD-10-CM

## 2024-10-05 DIAGNOSIS — Z8572 Personal history of non-Hodgkin lymphomas: Secondary | ICD-10-CM

## 2024-10-05 DIAGNOSIS — Z23 Encounter for immunization: Secondary | ICD-10-CM

## 2024-10-05 LAB — COMPREHENSIVE METABOLIC PANEL WITH GFR
ALT: 24 IU/L (ref 0–32)
AST: 29 IU/L (ref 0–40)
Albumin: 4.2 g/dL (ref 3.9–4.9)
Alkaline Phosphatase: 83 IU/L (ref 49–135)
BUN/Creatinine Ratio: 21 (ref 12–28)
BUN: 29 mg/dL — ABNORMAL HIGH (ref 8–27)
Bilirubin Total: 0.3 mg/dL (ref 0.0–1.2)
CO2: 22 mmol/L (ref 20–29)
Calcium: 9.1 mg/dL (ref 8.7–10.3)
Chloride: 102 mmol/L (ref 96–106)
Creatinine, Ser: 1.39 mg/dL — ABNORMAL HIGH (ref 0.57–1.00)
Globulin, Total: 2.1 g/dL (ref 1.5–4.5)
Glucose: 80 mg/dL (ref 70–99)
Potassium: 4.9 mmol/L (ref 3.5–5.2)
Sodium: 140 mmol/L (ref 134–144)
Total Protein: 6.3 g/dL (ref 6.0–8.5)
eGFR: 43 mL/min/1.73 — ABNORMAL LOW

## 2024-10-05 LAB — CBC WITH DIFFERENTIAL/PLATELET
Basophils Absolute: 0 x10E3/uL (ref 0.0–0.2)
Basos: 1 %
EOS (ABSOLUTE): 0.1 x10E3/uL (ref 0.0–0.4)
Eos: 1 %
Hematocrit: 33.4 % — ABNORMAL LOW (ref 34.0–46.6)
Hemoglobin: 10.7 g/dL — ABNORMAL LOW (ref 11.1–15.9)
Immature Grans (Abs): 0 x10E3/uL (ref 0.0–0.1)
Immature Granulocytes: 0 %
Lymphocytes Absolute: 1.8 x10E3/uL (ref 0.7–3.1)
Lymphs: 25 %
MCH: 31.8 pg (ref 26.6–33.0)
MCHC: 32 g/dL (ref 31.5–35.7)
MCV: 99 fL — ABNORMAL HIGH (ref 79–97)
Monocytes Absolute: 0.5 x10E3/uL (ref 0.1–0.9)
Monocytes: 6 %
Neutrophils Absolute: 4.9 x10E3/uL (ref 1.4–7.0)
Neutrophils: 67 %
Platelets: 233 x10E3/uL (ref 150–450)
RBC: 3.36 x10E6/uL — ABNORMAL LOW (ref 3.77–5.28)
RDW: 12.4 % (ref 11.7–15.4)
WBC: 7.4 x10E3/uL (ref 3.4–10.8)

## 2024-10-05 LAB — LIPID PANEL
Chol/HDL Ratio: 3.2 ratio (ref 0.0–4.4)
Cholesterol, Total: 189 mg/dL (ref 100–199)
HDL: 59 mg/dL
LDL Chol Calc (NIH): 113 mg/dL — ABNORMAL HIGH (ref 0–99)
Triglycerides: 97 mg/dL (ref 0–149)
VLDL Cholesterol Cal: 17 mg/dL (ref 5–40)

## 2024-10-05 MED ORDER — CLONAZEPAM 0.5 MG PO TABS
0.5000 mg | ORAL_TABLET | Freq: Two times a day (BID) | ORAL | 0 refills | Status: AC | PRN
  Filled 2024-10-05: qty 30, 15d supply, fill #0

## 2024-10-05 MED ORDER — CLOBETASOL PROPIONATE 0.05 % EX OINT: 1.0000 | TOPICAL_OINTMENT | Freq: Two times a day (BID) | CUTANEOUS | 0 refills | Status: AC

## 2024-10-05 NOTE — Progress Notes (Signed)
" ° °  Subjective:    Patient ID: Cheryl Knox, female    DOB: 03/22/1961, 64 y.o.   MRN: [id number]  HPI She is here for a follow-up visit.  She continues to do very nicely on Zepbound  and continues to lose weight.  She has no real problems from taking the medications.  She continues on Crestor  with no difficulty.  She is also taking losartan /HCTZ.  She does use clobetasol  periodically for various skin problems that works very well.  She is making some changes in her job and has been bought out by another farm.  She has over 200 days before the contract kicks in.  This has become stressful and they are apparently making some changes that she needs to work through.  She plans to get some counseling concerning that.  She admits to having some PTSD from previous stressful events that occurred in her life concerning the death of her husband on a trip to [place] also dealing with lymphoma and stress of that.  Clonazepam  does help with this.  She does plan to get involved in counseling with that as well.  She knows that she will need to do Pap pelvic as well as get a mammogram done.  Cologuard test will be done next fall.   Review of Systems     Objective:    Physical Exam Alert and in no distress.  Weight was reviewed with her.  No rash was noted on her back. She has apparently had both shingles vaccines.      Assessment & Plan:  Obesity (BMI 30.0-34.9)  Rash - Plan: clobetasol  ointment (TEMOVATE ) 0.05 %  Primary hypertension - Plan: CBC with Differential/Platelet, Comprehensive metabolic panel with GFR  Mixed hyperlipidemia - Plan: Lipid panel  History of organ or tissue transplant  History of lymphoma - Plan: CBC with Differential/Platelet, Comprehensive metabolic panel with GFR  Anxiety state - Plan: clonazePAM  (KLONOPIN ) 0.5 MG tablet  Need for vaccination against Streptococcus pneumoniae - Plan: Pneumococcal conjugate vaccine 20-valent (Prevnar 20)  She will continue on her present  medication regimen.  Discussed setting a particular goal pants or dress size as opposed to a particular weight. Also discussed getting involved in counseling and she mentioned a technique called EMDR.  Gave her the phone number for Darryl Hyers. Over 40 minutes spent in counseling and coordination of care. "

## 2024-10-06 ENCOUNTER — Ambulatory Visit: Payer: Self-pay | Admitting: Family Medicine

## 2024-10-06 ENCOUNTER — Telehealth: Payer: Self-pay

## 2024-10-06 NOTE — Telephone Encounter (Signed)
 Copied from CRM #[id number]. Topic: Clinical - Prescription Issue >> Oct 06, 2024  2:30 PM Nessti S wrote: Reason for CRM: tamigi called to make aware that zepbound  was denied and for further questions to call [phone]

## 2024-10-08 ENCOUNTER — Other Ambulatory Visit (HOSPITAL_COMMUNITY): Payer: Self-pay

## 2024-10-09 ENCOUNTER — Other Ambulatory Visit (HOSPITAL_COMMUNITY): Payer: Self-pay

## 2024-10-13 ENCOUNTER — Telehealth: Payer: Self-pay | Admitting: Family Medicine

## 2024-10-13 ENCOUNTER — Other Ambulatory Visit (HOSPITAL_COMMUNITY): Payer: Self-pay

## 2024-10-13 MED ORDER — WEGOVY 1 MG/0.5ML ~~LOC~~ SOAJ
1.0000 mg | SUBCUTANEOUS | 0 refills | Status: AC
  Filled 2024-10-13: qty 2, 28d supply, fill #0

## 2024-10-13 NOTE — Telephone Encounter (Signed)
 Zepbound  was denied, stating pt needs trial of Wegovy , per Dr. Joyce will switch to Wegovy  1mg ,  Dr. Joyce discussed this with pt.

## 2024-10-14 ENCOUNTER — Telehealth: Payer: Self-pay | Admitting: Family Medicine

## 2024-10-14 ENCOUNTER — Other Ambulatory Visit (HOSPITAL_COMMUNITY): Payer: Self-pay

## 2024-10-14 NOTE — Telephone Encounter (Signed)
 Called pharmacy & Wegovy  even though is preferred, it still requires P.A. I initiated P.A. thru Cover My meds

## 2024-10-15 ENCOUNTER — Other Ambulatory Visit (HOSPITAL_COMMUNITY): Payer: Self-pay

## 2024-10-21 ENCOUNTER — Telehealth (HOSPITAL_COMMUNITY): Payer: Self-pay

## 2024-10-21 ENCOUNTER — Other Ambulatory Visit (HOSPITAL_COMMUNITY): Payer: Self-pay

## 2024-10-21 NOTE — Telephone Encounter (Signed)
 Where is this request coming from? Cheryl Knox  Medication: Wegovy  1mg  Prior authorization required? Yes If YES, on primary or secondary insurance? Primary Comments:

## 2024-10-22 ENCOUNTER — Other Ambulatory Visit (HOSPITAL_COMMUNITY): Payer: Self-pay

## 2024-10-22 NOTE — Telephone Encounter (Signed)
 PA request has been Received. New Encounter has been or will be created for follow up. For additional info see Pharmacy Prior Auth telephone encounter from 10/22/24.
